# Patient Record
Sex: Female | Born: 1945 | Race: White | Hispanic: No | Smoking: Never smoker
Health system: Southern US, Community
[De-identification: ages and names within clinical notes are randomized; demographics above are authoritative.]

## PROBLEM LIST (undated history)

## (undated) DIAGNOSIS — M810 Age-related osteoporosis without current pathological fracture: Secondary | ICD-10-CM

## (undated) DIAGNOSIS — N3281 Overactive bladder: Secondary | ICD-10-CM

## (undated) DIAGNOSIS — N952 Postmenopausal atrophic vaginitis: Secondary | ICD-10-CM

## (undated) DIAGNOSIS — G47411 Narcolepsy with cataplexy: Secondary | ICD-10-CM

## (undated) DIAGNOSIS — I471 Supraventricular tachycardia, unspecified: Secondary | ICD-10-CM

## (undated) DIAGNOSIS — N390 Urinary tract infection, site not specified: Secondary | ICD-10-CM

## (undated) DIAGNOSIS — B2790 Infectious mononucleosis, unspecified without complication: Secondary | ICD-10-CM

## (undated) DIAGNOSIS — R519 Headache, unspecified: Secondary | ICD-10-CM

## (undated) DIAGNOSIS — T7840XA Allergy, unspecified, initial encounter: Secondary | ICD-10-CM

## (undated) DIAGNOSIS — I10 Essential (primary) hypertension: Secondary | ICD-10-CM

## (undated) DIAGNOSIS — F329 Major depressive disorder, single episode, unspecified: Secondary | ICD-10-CM

## (undated) DIAGNOSIS — K219 Gastro-esophageal reflux disease without esophagitis: Secondary | ICD-10-CM

## (undated) DIAGNOSIS — J45909 Unspecified asthma, uncomplicated: Secondary | ICD-10-CM

## (undated) DIAGNOSIS — R51 Headache: Secondary | ICD-10-CM

## (undated) DIAGNOSIS — I509 Heart failure, unspecified: Secondary | ICD-10-CM

## (undated) DIAGNOSIS — E785 Hyperlipidemia, unspecified: Secondary | ICD-10-CM

## (undated) DIAGNOSIS — M1990 Unspecified osteoarthritis, unspecified site: Secondary | ICD-10-CM

## (undated) DIAGNOSIS — D649 Anemia, unspecified: Secondary | ICD-10-CM

## (undated) DIAGNOSIS — B3324 Viral cardiomyopathy: Secondary | ICD-10-CM

## (undated) DIAGNOSIS — N6019 Diffuse cystic mastopathy of unspecified breast: Secondary | ICD-10-CM

## (undated) DIAGNOSIS — F32A Depression, unspecified: Secondary | ICD-10-CM

## (undated) DIAGNOSIS — J449 Chronic obstructive pulmonary disease, unspecified: Secondary | ICD-10-CM

## (undated) DIAGNOSIS — I429 Cardiomyopathy, unspecified: Secondary | ICD-10-CM

## (undated) DIAGNOSIS — N819 Female genital prolapse, unspecified: Secondary | ICD-10-CM

## (undated) DIAGNOSIS — I639 Cerebral infarction, unspecified: Secondary | ICD-10-CM

## (undated) DIAGNOSIS — G9332 Myalgic encephalomyelitis/chronic fatigue syndrome: Secondary | ICD-10-CM

## (undated) HISTORY — DX: Hyperlipidemia, unspecified: E78.5

## (undated) HISTORY — DX: Female genital prolapse, unspecified: N81.9

## (undated) HISTORY — PX: BACK SURGERY: SHX140

## (undated) HISTORY — DX: Cardiomyopathy, unspecified: I42.9

## (undated) HISTORY — DX: Gastro-esophageal reflux disease without esophagitis: K21.9

## (undated) HISTORY — DX: Essential (primary) hypertension: I10

## (undated) HISTORY — DX: Infectious mononucleosis, unspecified without complication: B27.90

## (undated) HISTORY — DX: Unspecified asthma, uncomplicated: J45.909

## (undated) HISTORY — DX: Diffuse cystic mastopathy of unspecified breast: N60.19

## (undated) HISTORY — DX: Allergy, unspecified, initial encounter: T78.40XA

## (undated) HISTORY — PX: ABLATION: SHX5711

## (undated) HISTORY — DX: Urinary tract infection, site not specified: N39.0

## (undated) HISTORY — DX: Depression, unspecified: F32.A

## (undated) HISTORY — DX: Major depressive disorder, single episode, unspecified: F32.9

## (undated) HISTORY — DX: Overactive bladder: N32.81

## (undated) HISTORY — PX: CARPAL TUNNEL RELEASE: SHX101

## (undated) HISTORY — PX: OTHER SURGICAL HISTORY: SHX169

## (undated) HISTORY — PX: BLADDER SURGERY: SHX569

## (undated) HISTORY — DX: Unspecified osteoarthritis, unspecified site: M19.90

## (undated) HISTORY — DX: Cerebral infarction, unspecified: I63.9

## (undated) HISTORY — PX: BREAST BIOPSY: SHX20

## (undated) HISTORY — DX: Postmenopausal atrophic vaginitis: N95.2

## (undated) HISTORY — DX: Anemia, unspecified: D64.9

## (undated) HISTORY — PX: NECK SURGERY: SHX720

## (undated) HISTORY — PX: TONSILLECTOMY: SUR1361

---

## 1988-03-27 HISTORY — PX: CERVICAL SPINE SURGERY: SHX589

## 1998-02-10 ENCOUNTER — Ambulatory Visit (HOSPITAL_COMMUNITY): Admission: RE | Admit: 1998-02-10 | Discharge: 1998-02-10 | Payer: Self-pay | Admitting: Obstetrics & Gynecology

## 1998-02-10 ENCOUNTER — Encounter: Payer: Self-pay | Admitting: Obstetrics & Gynecology

## 1998-09-24 ENCOUNTER — Encounter: Payer: Self-pay | Admitting: Emergency Medicine

## 1998-09-25 ENCOUNTER — Inpatient Hospital Stay (HOSPITAL_COMMUNITY): Admission: EM | Admit: 1998-09-25 | Discharge: 1998-09-30 | Payer: Self-pay | Admitting: Emergency Medicine

## 1998-09-28 ENCOUNTER — Encounter: Payer: Self-pay | Admitting: Internal Medicine

## 1998-09-29 ENCOUNTER — Encounter: Payer: Self-pay | Admitting: Internal Medicine

## 1999-04-07 ENCOUNTER — Other Ambulatory Visit: Admission: RE | Admit: 1999-04-07 | Discharge: 1999-04-07 | Payer: Self-pay | Admitting: Obstetrics & Gynecology

## 2000-08-07 ENCOUNTER — Other Ambulatory Visit: Admission: RE | Admit: 2000-08-07 | Discharge: 2000-08-07 | Payer: Self-pay | Admitting: Obstetrics & Gynecology

## 2001-08-26 ENCOUNTER — Other Ambulatory Visit: Admission: RE | Admit: 2001-08-26 | Discharge: 2001-08-26 | Payer: Self-pay | Admitting: Obstetrics & Gynecology

## 2002-03-05 ENCOUNTER — Emergency Department (HOSPITAL_COMMUNITY): Admission: EM | Admit: 2002-03-05 | Discharge: 2002-03-06 | Payer: Self-pay | Admitting: Emergency Medicine

## 2002-05-30 ENCOUNTER — Encounter: Payer: Self-pay | Admitting: Neurosurgery

## 2002-06-03 ENCOUNTER — Inpatient Hospital Stay (HOSPITAL_COMMUNITY): Admission: RE | Admit: 2002-06-03 | Discharge: 2002-06-06 | Payer: Self-pay | Admitting: Neurosurgery

## 2002-06-03 ENCOUNTER — Encounter: Payer: Self-pay | Admitting: Neurosurgery

## 2002-09-01 ENCOUNTER — Other Ambulatory Visit: Admission: RE | Admit: 2002-09-01 | Discharge: 2002-09-01 | Payer: Self-pay | Admitting: Obstetrics & Gynecology

## 2002-09-15 ENCOUNTER — Ambulatory Visit (HOSPITAL_COMMUNITY): Admission: RE | Admit: 2002-09-15 | Discharge: 2002-09-15 | Payer: Self-pay | Admitting: Internal Medicine

## 2003-06-11 ENCOUNTER — Ambulatory Visit (HOSPITAL_COMMUNITY): Admission: RE | Admit: 2003-06-11 | Discharge: 2003-06-11 | Payer: Self-pay | Admitting: Neurosurgery

## 2003-08-28 ENCOUNTER — Emergency Department (HOSPITAL_COMMUNITY): Admission: EM | Admit: 2003-08-28 | Discharge: 2003-08-29 | Payer: Self-pay | Admitting: Emergency Medicine

## 2003-10-06 ENCOUNTER — Other Ambulatory Visit: Admission: RE | Admit: 2003-10-06 | Discharge: 2003-10-06 | Payer: Self-pay | Admitting: Obstetrics & Gynecology

## 2004-05-04 ENCOUNTER — Ambulatory Visit: Payer: Self-pay | Admitting: Internal Medicine

## 2004-11-06 ENCOUNTER — Emergency Department (HOSPITAL_COMMUNITY): Admission: EM | Admit: 2004-11-06 | Discharge: 2004-11-06 | Payer: Self-pay | Admitting: Emergency Medicine

## 2005-02-04 ENCOUNTER — Emergency Department (HOSPITAL_COMMUNITY): Admission: EM | Admit: 2005-02-04 | Discharge: 2005-02-04 | Payer: Self-pay | Admitting: Emergency Medicine

## 2005-04-06 ENCOUNTER — Other Ambulatory Visit: Admission: RE | Admit: 2005-04-06 | Discharge: 2005-04-06 | Payer: Self-pay | Admitting: Obstetrics & Gynecology

## 2006-02-11 IMAGING — CR DG CHEST 2V
2 series · 2 of 2 positions shown · non-contrast
Comparison: 05/30/02.

CLINICAL DATA: Asthma, short of breath, hypertension, cardiomyopathy, history of Cam[place] infection, right carpal tunnel syndrome, preoperative evaluation. 
 TWO VIEW CHEST

[view not recorded (1 of 2)]
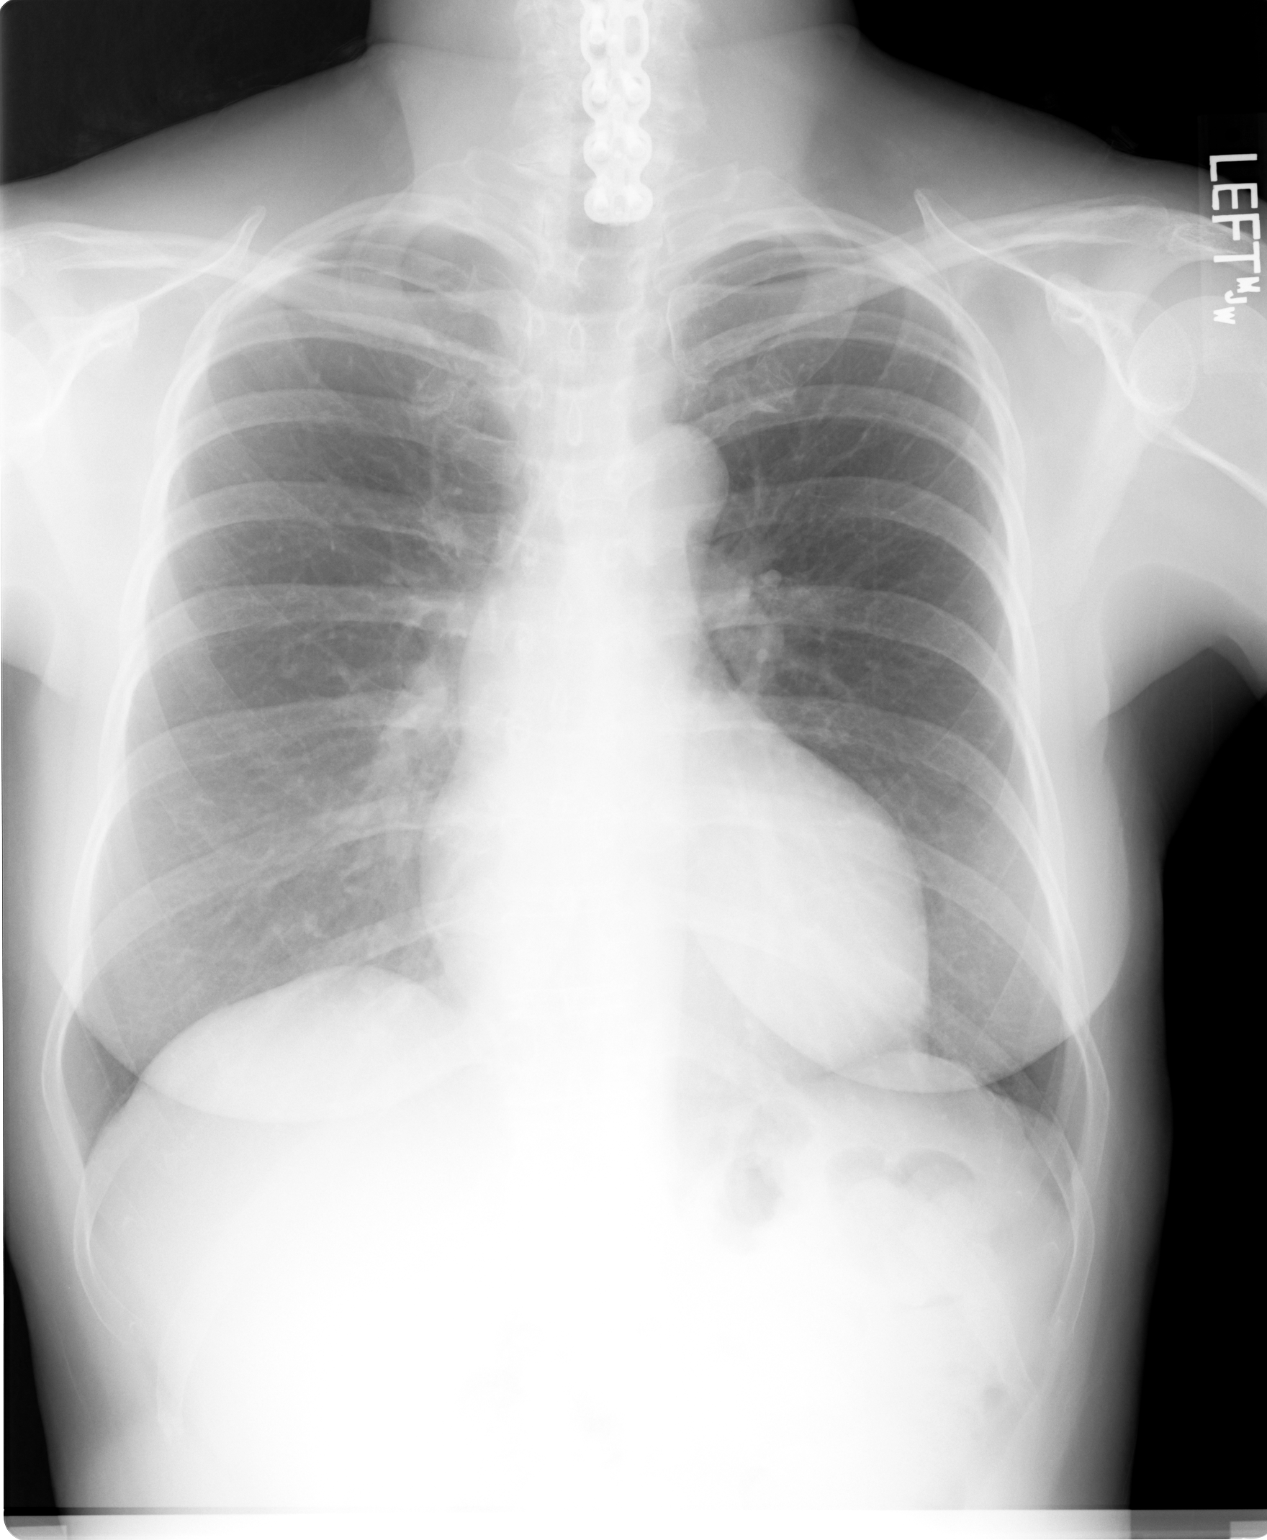

[view not recorded (2 of 2)]
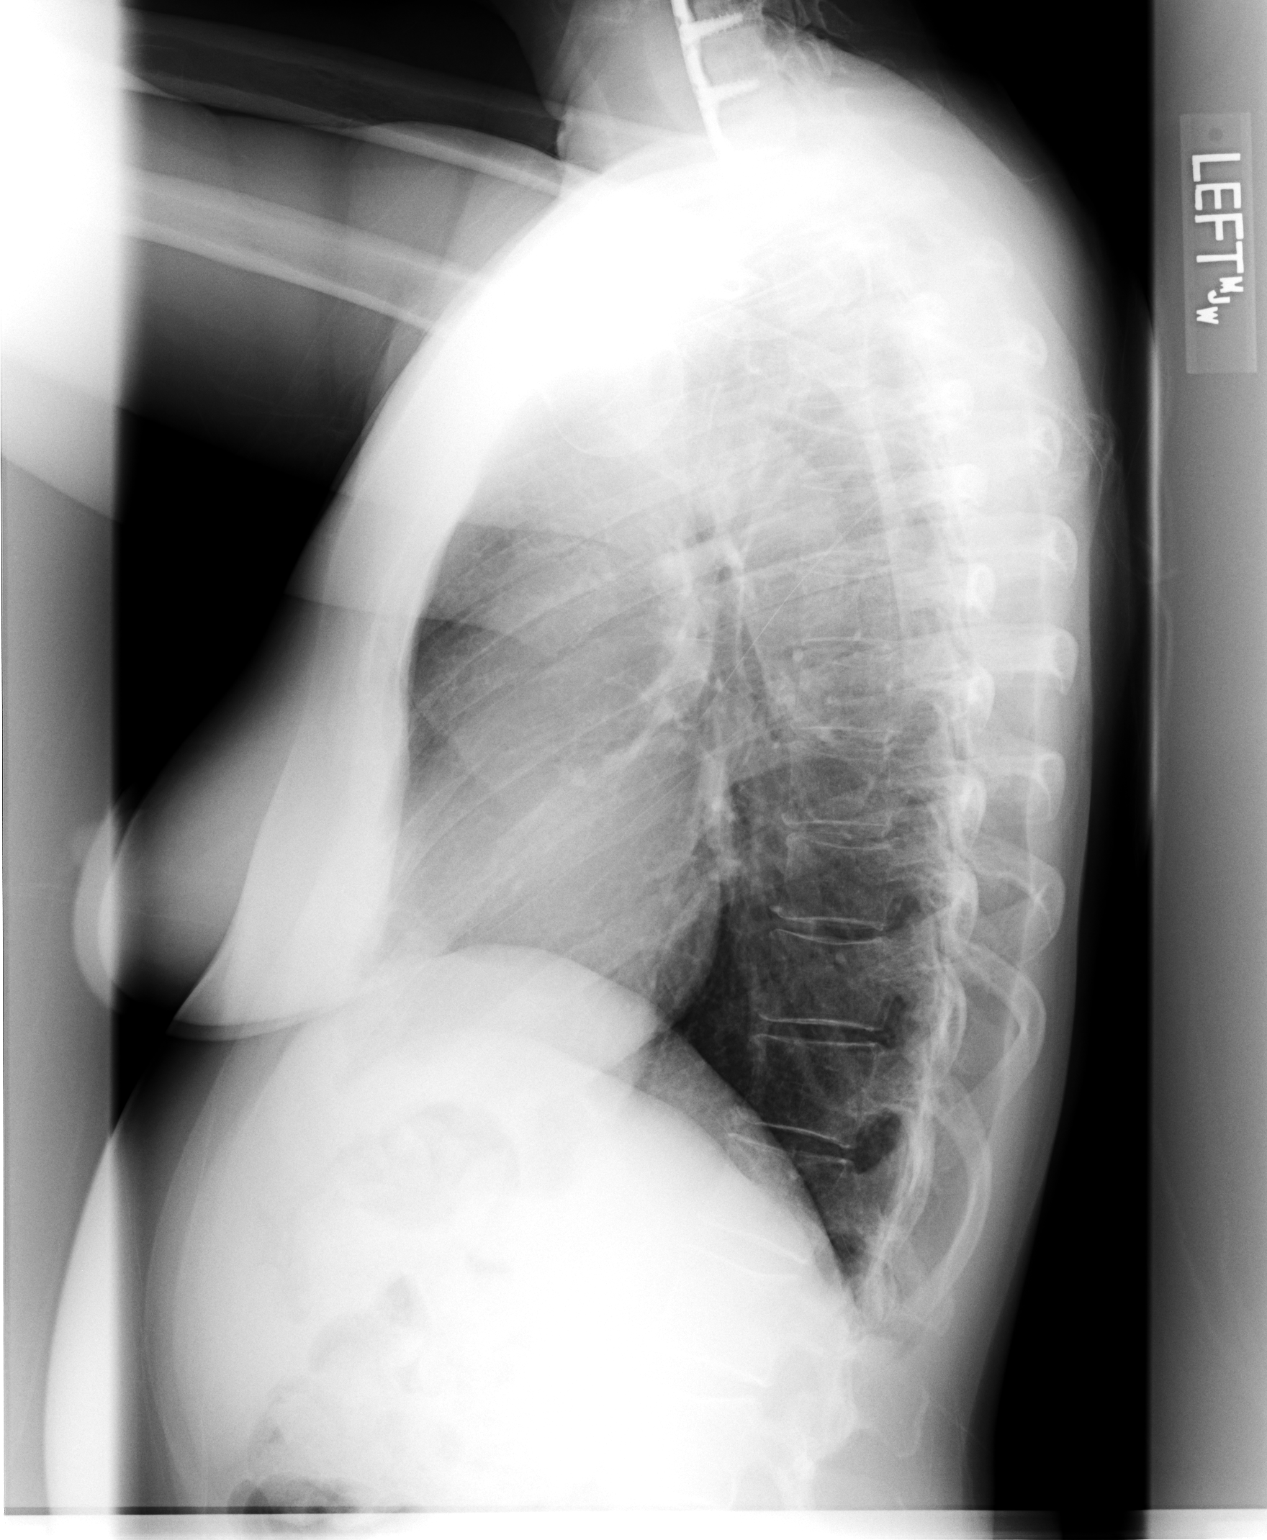

[2 of 2 positions shown; findings below may reference images not displayed]

Borderline enlargement of the cardiac silhouette with improvement.  Clear lungs.  Screw and plate fixation of the lower cervical spine. 
 IMPRESSION
 Borderline cardiomegaly with improvement.  No acute abnormality.

## 2006-08-06 ENCOUNTER — Ambulatory Visit: Payer: Self-pay | Admitting: Internal Medicine

## 2006-08-24 ENCOUNTER — Ambulatory Visit: Payer: Self-pay | Admitting: Obstetrics and Gynecology

## 2006-08-31 ENCOUNTER — Ambulatory Visit: Payer: Self-pay | Admitting: Obstetrics and Gynecology

## 2006-11-08 ENCOUNTER — Ambulatory Visit: Payer: Self-pay | Admitting: Internal Medicine

## 2006-11-29 ENCOUNTER — Ambulatory Visit: Payer: Self-pay | Admitting: Internal Medicine

## 2007-04-15 ENCOUNTER — Encounter: Admission: RE | Admit: 2007-04-15 | Discharge: 2007-04-15 | Payer: Self-pay | Admitting: Neurology

## 2007-08-29 ENCOUNTER — Ambulatory Visit: Payer: Self-pay | Admitting: Obstetrics and Gynecology

## 2008-03-27 DIAGNOSIS — I639 Cerebral infarction, unspecified: Secondary | ICD-10-CM

## 2008-03-27 HISTORY — DX: Cerebral infarction, unspecified: I63.9

## 2008-12-02 ENCOUNTER — Ambulatory Visit: Payer: Self-pay | Admitting: Obstetrics and Gynecology

## 2010-01-12 ENCOUNTER — Ambulatory Visit: Payer: Self-pay | Admitting: Obstetrics and Gynecology

## 2010-08-12 NOTE — Op Note (Signed)
NAMEALAJA, GOLDINGER                       ACCOUNT NO.:  [id number]   MEDICAL RECORD NO.:  [id number]                   PATIENT TYPE:  INP   LOCATION:  3172                                 FACILITY:  [facility]   PHYSICIAN:  Danae Orleans. Venetia Maxon, M.D.               DATE OF BIRTH:  December 09, 1945   DATE OF PROCEDURE:  06/03/2002  DATE OF DISCHARGE:                                 OPERATIVE REPORT   PREOPERATIVE DIAGNOSES:  Herniated cervical disk with cervical stenosis,  cervical spondylosis with myelopathy, degenerative disk disease, and  cervical radiculopathy, C3-4, C4-5, C5-6, and C6-7 levels.   POSTOPERATIVE DIAGNOSES:  Herniated cervical disk with cervical stenosis,  cervical spondylosis with myelopathy, degenerative disk disease, and  cervical radiculopathy, C3-4, C4-5, C5-6, and C6-7 levels.   PROCEDURE:  Anterior cervical decompression and fusion, C3-4, C4-5, C5-6,  and C6-7 levels with allograft bone graft and 77 mm Premier anterior  cervical plate.   SURGEON:  Danae Orleans. Venetia Maxon, M.D.   ASSISTANT:  Hilda Lias, M.D.   ANESTHESIA:  General endotracheal anesthesia.   ESTIMATED BLOOD LOSS:  75 mL.   COMPLICATIONS:  None.   DISPOSITION:  To recovery.   INDICATIONS:  The patient is a 65 year old woman with cervical spondylitic  myelopathy with multiple levels of spinal canal compromise and cord  compression, with bilateral upper extremity weakness and a long history of  neck and bilateral upper extremity pain.  It was elected to take her to  surgery for decompression and fusion at the affected levels.   DESCRIPTION OF PROCEDURE:  The patient was brought to the operating room.  Following the satisfactory and uncomplicated induction of general  endotracheal anesthesia and placement of intravenous lines, the patient  placed in a supine position on the operating room table.  Her neck was  placed in slight extension, and she was placed in 10 pounds of Holter  traction.  Her  anterior neck was the prepped and draped in the usual sterile  fashion.  The carotid tubercle was palpated on the left and then a  transverse incision was planned just superior to that from the midline to  the anterior border of the sternocleidomastoid muscle.  The platysmal layer  was incised sharply.  Subplatysmal dissection was performed to facilitate  exposure.  A bent spinal needle was placed at what was felt to be the C3-4  level, and this was confirmed on intraoperative x-ray.  Subsequently the  longus colli muscles were taken down from the anterior cervical spine from  C3 through C7 levels using electrocautery and Key elevator.  The Shadow Line  self-retaining retractor was placed to facilitate exposure along with an up-  down retractor.  Each of the interspaces was then incised with a 15 blade.  Each of the levels was extremely degenerated.  The Leksell rongeur was used  to remove ventral osteophytes.  The disk space spreader was initially placed  at the C5-6 level, and this disk space was cleared of residual disk material  and end plates stripped of residual cartilaginous and disk material.  Subsequently each of the end plates at each of the affected levels was then  similarly decompressed.  The operating microscope was then brought into the  field and initially at the C6-7 level the self-retaining retractor was  placed and under microscopic visualization, the end plates at C6 and C7 were  decorticated with the high-speed drill and the A2 equivalent bur.  The  uncinate spurs were drilled down.  Subsequently the posterior longitudinal  ligament and uncinate spurs were removed with 2 mm gold-tipped Kerrison  rongeur, resulting in significant decompression of the spinal canal and C7  nerve roots.  Hemostasis was assured with Gelfoam soaked in thrombin.  A 7  mm tricortical iliac crest bone graft was found to fit appropriately after  sizing with dummy sizers.  This was then cut down to  a depth of 13 mm and  inserted in the interspace and then counter sunk appropriately.  Attention  was then turned to the C5-6 level, where similar decompression was  performed.  At this level there was a densely calcified bone spur to the  right side of the midline, which was significantly compressing the spinal  cord and was compressing the C6 nerve root was it extended out the neural  foramen.  Following decompression of the central spinal cord and right  neural foramen, the left C6 nerve root was similarly decompressed.  Again  Gelfoam soaked in thrombin was used to facilitate hemostasis and a similarly-  sized bone graft was shaped, inserted, and counter sunk appropriately.  Attention was then turned to the C3-4 level, where similar decompression was  performed, the spinal cord was decompressed, and the posterior longitudinal  ligament removed.  The C4 nerve roots were decompressed bilaterally.  Hemostasis was again assured with Gelfoam soaked in thrombin, and a 7 mm  bone graft was cut to the appropriate depth and inserted into the interspace  and counter sunk appropriately.  Finally at the C4-5 level, similar  decompression was performed and at this level again there was a densely  calcified osteophyte, which was adherent to the dura.  This was very  carefully elevated and resulted in significant decompression of the thecal  sac, the spinal cord dura, and the C5 nerve root neural foramina.  Care was  taken not to instrument the neural foramina out of concern for the fragility  of the C5 nerve roots.  Hemostasis was again assured, and then a similarly-  sized bone graft was inserted and countersunk appropriately.  The Holter  traction was then removed and a 77 mm Premier anterior cervical plate was  sized, found to fit appropriately, and then was affixed to the anterior  cervical spine using 13 mm fixed-angled screws at C7 and variable-angled screws at C3, C5, and C6 levels.  At the C4  level there was poor purchase of  the left-sided screw and it was elected not to place a screw at this level  after a rescue screw also did not have good purchase.  The locking  mechanisms were engaged after all the screws were tightened down, and a  final x-ray confirmed position of the bone grafts, restoration of the  cervical lordosis, and the proper positioning of the anterior cervical  plate.  The wound was then copiously irrigated with kanamycin and saline,  and there was found to be  excellent hemostasis.  Soft tissues were inspected  and found to be in good repair.  The platysmal layer was then closed with 3-  0 Vicryl sutures, the skin edges were reapproximated with running 4-0 Vicryl  subcuticular stitch, and the wound was dressed with Dermabond.  The patient  was placed in an Aspen collar, extubated in the operating room, and taken to  the recovery room in stable and satisfactory condition, having tolerated her  operation well.  Counts were correct at the end of the case.                                               Danae Orleans. Venetia Maxon, M.D.    JDS/MEDQ  D:  06/03/2002  T:  06/03/2002  Job:  [id number]

## 2010-08-12 NOTE — Op Note (Signed)
   NAME:  Marie Fisher, Marie Fisher                       ACCOUNT NO.:  [id number]   MEDICAL RECORD NO.:  [id number]                   PATIENT TYPE:  AMB   LOCATION:  ENDO                                 [facility]ACILITY[facility]  [facility]   PHYSICIAN:  Lina Sar, M.D. [facility]               DATE OF BIRTH:  08/09/1945   DATE OF PROCEDURE:  DATE OF DISCHARGE:                                 OPERATIVE REPORT   Copy only                                               Lina Sar, M.D. [facility]    DB/MEDQ  D:  09/15/2002  T:  09/15/2002  Job:  [id number]   cc:   Aram Beecham, M.D.  [facility]

## 2010-08-12 NOTE — Discharge Summary (Signed)
   NAMESELAH, KLANG                       ACCOUNT NO.:  [id number]   MEDICAL RECORD NO.:  [id number]                   PATIENT TYPE:  INP   LOCATION:  3008                                 FACILITY:  [facility]   PHYSICIAN:  Danae Orleans. Venetia Maxon, M.D.               DATE OF BIRTH:  [birth year]   DATE OF ADMISSION:  06/03/2002  DATE OF DISCHARGE:  06/06/2002                                 DISCHARGE SUMMARY   REASON FOR ADMISSION:  1. Cervical disk herniation.  2. 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  3. Cervical spondylosis with myelopathy.  4. Cervical disk degeneration.  5. Hypertension not otherwise specified.  6. Esophageal reflux.  7. Nonspecified viral infection.   DISCHARGE DIAGNOSES:  1. Cervical disk herniation.  2. 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  3. Cervical spondylosis with myelopathy.  4. Cervical disk degeneration.  5. Hypertension not otherwise specified.  6. Esophageal reflux.  7. Nonspecified viral infection.   HISTORY OF PRESENT ILLNESS:  The patient is a 65 year old woman with  cervical disk herniations and cervical myelopathy at the C3-4, C4-5, C5-6,  and C6-7 levels.  It was elected to take her to surgery for anterior  cervical decompression and fusion at these affected levels.   HOSPITAL COURSE:  She did well with the surgery and underwent anterior  cervical compression and fusion at C3-4, C4-5, C5-6, and C6-7 levels with an  allograft bone graft and anterior cervical plating.  Postoperatively, she  had improvement in upper extremity strength and also in her numbness.  She  was doing well.  On March 10, was given Decadron for 24 hours and was  otherwise without complaints.  She was immobilized on March 11 and doing  better with less numbness and on March 12 was doing well and at that point  she was discharged home with instructions to follow up with Dr. Venetia Maxon in his  office in three weeks with lateral cervical spine  radiographs.  She was  instructed to wear a collar, instructed not to engage in any driving or  lifting for the next 2-3 weeks and was given prescriptions of Vicodin,  Valium, along with her regular medications.                                               Danae Orleans. Venetia Maxon, M.D.    JDS/MEDQ  D:  07/27/2002  T:  07/27/2002  Job:  [id number]

## 2010-08-12 NOTE — Op Note (Signed)
   NAME:  Marie Fisher, Marie Fisher                       ACCOUNT NO.:  [id number]   MEDICAL RECORD NO.:  [id number]                   PATIENT TYPE:  AMB   LOCATION:  ENDO                                 FACILITY:  [facility]   PHYSICIAN:  Lina Sar, M.D. [facility]               DATE OF BIRTH:  Feb 06, 1946   DATE OF PROCEDURE:  DATE OF DISCHARGE:                                 OPERATIVE REPORT   NAME OF PROCEDURE:  Colonoscopy.   INDICATIONS:  This 65 year old white female has been found anemic.  Hemoglobin 11 g.  She has a history of chronic iron deficiency anemia and  has been on iron supplements.  Other GI problems have included hiatal hernia  and gastroesophageal reflux for which she takes Protonix.  On physical  examination on September 04, 2002 the physical examination was nondiagnostic.  She has had intermittent hematochezia.  For that reason she is undergoing  colonoscopy.   ENDOSCOPE:  Olympus single channel video endoscope.   SEDATION:  Versed 70 mg IV, Demerol 5 mg IV.   FINDINGS:  Olympus single channel video endoscope passed under direct vision  into rectum to sigmoid colon.  The patient was monitored by pulse oximeter.  Her oxygen saturations were normal.  Anal canal and rectal ampulla was  unremarkable.  Colonoscopy passed easily through the sigmoid colon which was  normal appearing mucosa.  There were no diverticula.  Descending colon,  splenic flexure, transverse colon, hepatic flexure was normal with normal  ascending colon at the cecum.  Cecal pouch was reached without difficulty  and ileocecal valve and appendiceal opening were noted.  There were no  lesions to explain GI blood loss.  Colonoscope was then slowly retracted  through right transverse to right colon and colon was decompressed.  The  patient tolerated procedure well.   IMPRESSION:  Normal colonoscopy to the cecum.  Nothing to account for  anemia.   PLAN:  1. Repeat colonoscopy in 10 years or earlier if patient  develops specific     symptoms.  2. Treat hematochezia with topical steroids for anorectal source of     bleeding.  3. Repeat Hemoccults every year and supplement iron as needed.                                               Lina Sar, M.D. [facility]    DB/MEDQ  D:  09/15/2002  T:  09/15/2002  Job:  [id number]   cc:   Freddy Finner, M.D.  [address] [place]  [place] [address]  Fax: [phone]

## 2010-08-12 NOTE — Op Note (Signed)
Marie Fisher, DRIVER                       ACCOUNT NO.:  [id number]   MEDICAL RECORD NO.:  [id number]                   PATIENT TYPE:  OIB   LOCATION:  2899                                 FACILITY:  [facility]   PHYSICIAN:  Danae Orleans. Venetia Maxon, M.D.               DATE OF BIRTH:  08-31-1945   DATE OF PROCEDURE:  06/11/2003  DATE OF DISCHARGE:                                 OPERATIVE REPORT   PREOPERATIVE DIAGNOSIS:  Right carpal tunnel syndrome.   POSTOPERATIVE DIAGNOSIS:  Right carpal tunnel syndrome.   PROCEDURE:  Right carpal tunnel release.   SURGEON:  Danae Orleans. Venetia Maxon, M.D.   ANESTHESIA:  MAC with local lidocaine.   COMPLICATIONS:  None.   DISPOSITION:  To the recovery room.   INDICATIONS FOR PROCEDURE:  Sabryn Preslar is a 65 year old woman with  right carpal tunnel syndrome.  It was elected to perform right carpal tunnel  release.   PROCEDURE:  Ms. Borello was brought to the operating room.  Following the  induction of monitored sedation, she was placed in the supine position on  the operating table.  The right arm was placed on an extremity table.  The  right hand and forearm were then prepped and draped in the usual sterile  fashion using a sterile stockinette and an extremity drape.  The plane of  incision was marked and infiltrated with local lidocaine without  epinephrine.  An incision was made just distal to the first volar wrist  crease extending approximately 2 cm into the palm of the hand in line with  the fourth ray.  It was carried through the flexor retinaculum which  appeared to be quite thickened.  This was then incised with a 15 blade and  the incision was carried proximally in the volar forearm and distally into  the palm with good release of the carpal tunnel.  There did not appear to be  any persistent compression.  The wound was then copiously irrigated with  saline and closed with interrupted vertical mattress 3-0 nylon stitches.  The wound was dressed  with a sterile occlusive fluff and Kerlix dressing.  The patient was taken to the recovery room in stable, satisfactory  condition, having tolerated the operation well.  Counts were correct at the  end of the case.                                               Danae Orleans. Venetia Maxon, M.D.    JDS/MEDQ  D:  06/11/2003  T:  06/12/2003  Job:  [id number]

## 2011-03-29 ENCOUNTER — Ambulatory Visit: Payer: Self-pay | Admitting: Obstetrics and Gynecology

## 2012-04-01 ENCOUNTER — Ambulatory Visit: Payer: Self-pay | Admitting: Obstetrics and Gynecology

## 2012-08-14 ENCOUNTER — Encounter: Payer: Self-pay | Admitting: Internal Medicine

## 2012-09-10 ENCOUNTER — Encounter: Payer: Self-pay | Admitting: Internal Medicine

## 2012-10-07 DIAGNOSIS — I471 Supraventricular tachycardia: Secondary | ICD-10-CM | POA: Insufficient documentation

## 2012-10-18 ENCOUNTER — Encounter: Payer: Self-pay | Admitting: Internal Medicine

## 2012-11-01 ENCOUNTER — Encounter: Payer: Self-pay | Admitting: Internal Medicine

## 2012-11-06 ENCOUNTER — Ambulatory Visit (AMBULATORY_SURGERY_CENTER): Payer: Medicare Other | Admitting: *Deleted

## 2012-11-06 VITALS — Ht 62.0 in | Wt 137.0 lb

## 2012-11-06 DIAGNOSIS — Z1211 Encounter for screening for malignant neoplasm of colon: Secondary | ICD-10-CM

## 2012-11-06 MED ORDER — MOVIPREP 100 G PO SOLR: ORAL | Status: DC

## 2012-11-06 NOTE — Progress Notes (Signed)
Patient denies any allergies to eggs or soy. Patient denies any problems with anesthesia.  

## 2012-11-07 ENCOUNTER — Encounter: Payer: Self-pay | Admitting: Internal Medicine

## 2012-11-20 ENCOUNTER — Encounter: Payer: Self-pay | Admitting: Internal Medicine

## 2012-12-18 ENCOUNTER — Ambulatory Visit (AMBULATORY_SURGERY_CENTER): Payer: Medicare Other | Admitting: Internal Medicine

## 2012-12-18 ENCOUNTER — Encounter: Payer: Self-pay | Admitting: Internal Medicine

## 2012-12-18 VITALS — BP 133/72 | HR 61 | Temp 98.5°F | Resp 19 | Ht 62.0 in | Wt 137.0 lb

## 2012-12-18 DIAGNOSIS — Z1211 Encounter for screening for malignant neoplasm of colon: Secondary | ICD-10-CM

## 2012-12-18 MED ORDER — SODIUM CHLORIDE 0.9 % IV SOLN: 500.0000 mL | INTRAVENOUS | Status: DC

## 2012-12-18 NOTE — Progress Notes (Signed)
Report to pacu rn, vss, bbs=clear 

## 2012-12-18 NOTE — Op Note (Signed)
[facility] [address] [place], [address]   COLONOSCOPY PROCEDURE REPORT  PATIENT: Marie Fisher, Marie Fisher  MR#: [id number] BIRTHDATE: March 31, 1945 , 66  yrs. old GENDER: Female ENDOSCOPIST: Hart Carwin, MD REFERRED XB:JYNWGN colonoscopy PROCEDURE DATE:  12/18/2012 PROCEDURE:   Colonoscopy, screening , Dr Konrad Dolores First Screening Colonoscopy - Avg.  risk and is 50 yrs.  old or older - No.  Prior Negative Screening - Now for repeat screening. 10 or more years since last screening  History of Adenoma - Now for follow-up colonoscopy & has been > or = to 3 yrs.  N/A  Polyps Removed Today? No.  Recommend repeat exam, <10 yrs? No. ASA CLASS:   Class II INDICATIONS:Average risk patient for colon cancer. MEDICATIONS: MAC sedation, administered by CRNA and propofol (Diprivan) 250mg  IV  DESCRIPTION OF PROCEDURE:   After the risks benefits and alternatives of the procedure were thoroughly explained, informed consent was obtained.  A digital rectal exam revealed no abnormalities of the rectum.   The LB [id number]-[id number]  endoscope was introduced through the anus and advanced to the cecum, which was identified by both the appendix and ileocecal valve. No adverse events experienced.   The quality of the prep was good, using MoviPrep  The instrument was then slowly withdrawn as the colon was fully examined.      COLON FINDINGS: Moderate diverticulosis was noted in the sigmoid colon.  Retroflexed views revealed no abnormalities. The time to cecum=8 minutes 57 seconds.  Withdrawal time=6 minutes 53 seconds. The scope was withdrawn and the procedure completed. COMPLICATIONS: There were no complications.  ENDOSCOPIC IMPRESSION: Moderate diverticulosis was noted in the sigmoid colon  RECOMMENDATIONS: High fiber diet   eSigned:  Hart Carwin, MD 12/18/2012 2:37 PM   cc:

## 2012-12-18 NOTE — Patient Instructions (Addendum)

## 2012-12-19 ENCOUNTER — Telehealth: Payer: Self-pay | Admitting: *Deleted

## 2012-12-19 NOTE — Telephone Encounter (Signed)
  Follow up Call-  Call back number 12/18/2012  Post procedure Call Back phone  # [phone]  Permission to leave phone message Yes     Patient questions:  Do you have a fever, pain , or abdominal swelling? no Pain Score  0 *  Have you tolerated food without any problems? yes  Have you been able to return to your normal activities? yes  Do you have any questions about your discharge instructions: Diet   no Medications  no Follow up visit  no  Do you have questions or concerns about your Care? no  Actions: * If pain score is 4 or above: No action needed, pain <4.

## 2012-12-26 DIAGNOSIS — I5042 Chronic combined systolic (congestive) and diastolic (congestive) heart failure: Secondary | ICD-10-CM | POA: Insufficient documentation

## 2012-12-26 DIAGNOSIS — I519 Heart disease, unspecified: Secondary | ICD-10-CM | POA: Insufficient documentation

## 2012-12-26 DIAGNOSIS — I341 Nonrheumatic mitral (valve) prolapse: Secondary | ICD-10-CM | POA: Insufficient documentation

## 2013-05-26 ENCOUNTER — Ambulatory Visit: Payer: Self-pay | Admitting: Internal Medicine

## 2013-12-18 ENCOUNTER — Ambulatory Visit: Payer: Self-pay

## 2014-03-02 ENCOUNTER — Ambulatory Visit: Payer: Self-pay

## 2014-06-03 ENCOUNTER — Ambulatory Visit: Payer: Self-pay | Admitting: Internal Medicine

## 2014-08-10 ENCOUNTER — Encounter: Payer: Self-pay | Admitting: Urology

## 2014-08-10 DIAGNOSIS — B2790 Infectious mononucleosis, unspecified without complication: Secondary | ICD-10-CM

## 2014-08-10 DIAGNOSIS — N3281 Overactive bladder: Secondary | ICD-10-CM

## 2014-08-10 DIAGNOSIS — K219 Gastro-esophageal reflux disease without esophagitis: Secondary | ICD-10-CM

## 2014-08-10 DIAGNOSIS — E785 Hyperlipidemia, unspecified: Secondary | ICD-10-CM

## 2014-08-10 DIAGNOSIS — N819 Female genital prolapse, unspecified: Secondary | ICD-10-CM | POA: Insufficient documentation

## 2014-08-10 DIAGNOSIS — N952 Postmenopausal atrophic vaginitis: Secondary | ICD-10-CM

## 2014-08-10 DIAGNOSIS — N6019 Diffuse cystic mastopathy of unspecified breast: Secondary | ICD-10-CM

## 2014-08-10 DIAGNOSIS — N390 Urinary tract infection, site not specified: Secondary | ICD-10-CM | POA: Insufficient documentation

## 2014-08-10 HISTORY — DX: Diffuse cystic mastopathy of unspecified breast: N60.19

## 2014-08-10 HISTORY — DX: Infectious mononucleosis, unspecified without complication: B27.90

## 2014-08-10 HISTORY — DX: Overactive bladder: N32.81

## 2014-08-10 HISTORY — DX: Gastro-esophageal reflux disease without esophagitis: K21.9

## 2014-08-10 HISTORY — DX: Postmenopausal atrophic vaginitis: N95.2

## 2014-08-10 HISTORY — DX: Hyperlipidemia, unspecified: E78.5

## 2014-08-10 HISTORY — DX: Female genital prolapse, unspecified: N81.9

## 2014-08-26 ENCOUNTER — Other Ambulatory Visit: Payer: Self-pay | Admitting: *Deleted

## 2014-08-26 ENCOUNTER — Ambulatory Visit (INDEPENDENT_AMBULATORY_CARE_PROVIDER_SITE_OTHER): Payer: Medicare PPO | Admitting: Urology

## 2014-08-26 ENCOUNTER — Encounter: Payer: Self-pay | Admitting: Urology

## 2014-08-26 VITALS — BP 136/87 | HR 71 | Ht 62.0 in | Wt 143.7 lb

## 2014-08-26 DIAGNOSIS — R350 Frequency of micturition: Secondary | ICD-10-CM | POA: Diagnosis not present

## 2014-08-26 NOTE — Progress Notes (Signed)
Session #8 Health & Social Factors:No change Caffeine #day:1 Alcohol #day:0 Daytime Voids #day:5 Night Time Voids # night:0-1 Urgency:Mild Incontinence Episodes # day:0 Ankle used:Left Treatment setting:9 Feeling/Response:Both Preformed OE:UMPNTIR McGowan PA-C Assistant:Arron Mcnaught Jimmye Norman CMA

## 2014-08-27 DIAGNOSIS — D649 Anemia, unspecified: Secondary | ICD-10-CM | POA: Insufficient documentation

## 2014-08-27 DIAGNOSIS — I10 Essential (primary) hypertension: Secondary | ICD-10-CM | POA: Insufficient documentation

## 2014-08-27 NOTE — Progress Notes (Signed)
Chief Complaint:  Chief Complaint  Patient presents with  . Urinary Frequency     HPI:     Patient's urinary incontinence began several years ago resulting in the patient undergoing a bladder sling procedure in 2008.   She does not recall a specific event that led to the urinary incontinence. Her amount of leakage is variable. She leaks slightly with laughing, straining, and or coughing. She leaks larger amounts when she experiences an urge to void. Her leakage can occur at any time.      After 7 treatments with the PTNS, she is not experiencing any urinary leakage. Over the last week, she had daytime frequency of 6 trips to the bathroom while awake. She experienced nocturia once nightly over the last week. She is not experiencing any pain or burning with urination.   She only has 1 caffeinated beverage daily and does not consume any alcohol.                      Previous Therapy:     She is doing Kegel's at home on her own.                                 Anticholinergics        Patient was started on Vesicare 5 mg daily and then increased to 10 mg daily in December 2015. Unfortunately, she suffered memory loss 2 months later and had to discontinue the Vesicare.     Other previous therapy          Patient is currently on 50 mg of Myrbetriq daily.  She is also on Macrobid 100 mg 1 capsule daily.  BP 136/87 mmHg  Pulse 71  Ht 5\' 2"  (1.575 m)  Wt 143 lb 11.2 oz (65.182 kg)  BMI 26.28 kg/m2  LMP  (Exact Date)  Patient had no contraindications present for PTNS therapy.  She denied having a pacemaker, an implantable defibrillator, history of abnormal bleeding and/or history of neuropath these or nerve damage.        Discussed with patient possible complications of procedure, such as discomfort, bleeding at insertion/stimulation site, procedure consent signed  Patient goals:     Patient's goals for PTNS are reduced urinary urgency and reduced frequency  PTNS treatment:     The  patient's left ankle is cleansed with rubbing alcohol. The needle electrode was inserted into the lower, inner aspect of patient's leg. A surface electrode was placed on the inside arch of the foot on the treatment leg. The lead set was connected to the stimulator and the needle electrode clip was connected to the needle electrode. The stimulator that produces an adjustable electrical pulse that travels to sacral nerve plexus via the tibial and nerve was increased to 9 and patient received a toe flex and sensory response to the stimulus. She tolerated the procedure well for 30 minutes.  Treatment Plan:     Patient has been pleased so far with her treatment progress. She will return next week for her 9th PTNS treatment.

## 2014-09-02 ENCOUNTER — Ambulatory Visit (INDEPENDENT_AMBULATORY_CARE_PROVIDER_SITE_OTHER): Payer: Medicare PPO | Admitting: Urology

## 2014-09-02 ENCOUNTER — Encounter: Payer: Self-pay | Admitting: Urology

## 2014-09-02 VITALS — BP 162/82 | HR 79 | Ht 62.0 in | Wt 143.9 lb

## 2014-09-02 DIAGNOSIS — R350 Frequency of micturition: Secondary | ICD-10-CM

## 2014-09-02 NOTE — Progress Notes (Signed)
PTNS  Session # 9 Health & Social Factors: Change out of town again Caffeine: 1 Alcohol: 0 Daytime voids #per day: 7 Night-time voids #per night: 2 Urgency: 2 Incontinence Episodes #per day: 1 Ankle used: Left Treatment Setting: 13 Feeling/ Response: Both  Preformed By: Zara Council PA-C   Assistant: Lyndee Hensen CMA  Follow Up: One week

## 2014-09-05 DIAGNOSIS — R350 Frequency of micturition: Secondary | ICD-10-CM | POA: Insufficient documentation

## 2014-09-05 NOTE — Progress Notes (Signed)
Chief Complaint:  Chief Complaint  Patient presents with  . Overactive bladder    PTNS      HPI:     Patient's urinary incontinence began several years ago resulting in the patient undergoing a bladder sling procedure in 2008.   She does not recall a specific event that led to the urinary incontinence. Her amount of leakage is variable. She leaks slightly with laughing, straining, and or coughing. She leaks larger amounts when she experiences an urge to void. Her leakage can occur at any time.      After 8 treatments with the PTNS, she had worsening of her urinary symptoms.  Over the last week, she had 1 episode of urinary incontinence. She had daytime frequency of 7 trips to the bathroom while awake. She experienced nocturia twice nightly over the last week. She is not experiencing any pain or burning with urination.   She only has 1 caffeine beverage daily and does not consume any alcohol.                      Previous Therapy:     She is doing Keg[employer] at home on her own.                                Anticholinergics        Patient was started on Vesicare 5 mg daily and then increased to 10 mg daily in December 2015. Unfortunately, she suffered memory loss 2 months later and had to discontinue the Vesicare.     Other previous therapy          Patient is currently on 50 mg of Myrbetriq daily.  She is also on Macrobid 100 mg 1 capsule daily.  Blood pressure 162/82, pulse 79, height 5\' 2"  (1.575 m), weight 143 lb 14.4 oz (65.273 kg).  Patient has no contraindications present for PTNS therapy.  She denied having a pacemaker, an implantable defibrillator, history of abnormal bleeding and/or history of neuropath these or nerve damage.    Patient goals:     Patient's goals for PTNS are reduced urinary urgency and reduced frequency  PTNS treatment:     The patient's left ankle is cleansed with rubbing alcohol. The needle electrode was inserted into the lower, inner aspect of patient's  leg. A surface electrode was placed on the inside arch of the foot on the treatment leg. The lead set was connected to the stimulator and the needle electrode clip was connected to the needle electrode. The stimulator that produces an adjustable electrical pulse that travels to sacral nerve plexus via the tibial and nerve was increased to 13 and patient received a toe flex and sensory response to the stimulus. She tolerated the procedure well for 30 minutes.  Treatment Plan:     Patient is frustrated with her urinary symptoms over the last week. She had been occupied preparing for her grandchild birthday party and attending the birthday party and she experienced a worsening in her symptoms. She believes she did not take her Myrbetriq  that day and that contributed to worsening of her symptoms.. She will return next week for her 10th PTNS treatment.  I encouraged her to continue with the treatment and not allow the setback to discourage her.

## 2014-09-09 ENCOUNTER — Ambulatory Visit (INDEPENDENT_AMBULATORY_CARE_PROVIDER_SITE_OTHER): Payer: Medicare PPO | Admitting: Urology

## 2014-09-09 ENCOUNTER — Encounter: Payer: Self-pay | Admitting: Urology

## 2014-09-09 VITALS — BP 111/69 | HR 91 | Ht 64.0 in | Wt 141.9 lb

## 2014-09-09 DIAGNOSIS — R350 Frequency of micturition: Secondary | ICD-10-CM

## 2014-09-09 NOTE — Progress Notes (Signed)
PTNS  Session # 10  Health & Social Factors: No Change Caffeine:1 Alcohol: 0 Daytime voids #per day: 7 Night-time voids #per night: 1 Urgency: strong + Incontinence Episodes #per day: 0 Ankle used: Left  Treatment Setting: 7  Feeling/ Response: both  Preformed By: Dwaine Gale PA-C  Assistant: Lyndee Hensen CMA   Follow Up: One week

## 2014-09-16 ENCOUNTER — Encounter: Payer: Self-pay | Admitting: Urology

## 2014-09-16 ENCOUNTER — Ambulatory Visit (INDEPENDENT_AMBULATORY_CARE_PROVIDER_SITE_OTHER): Payer: Medicare PPO | Admitting: Urology

## 2014-09-16 VITALS — BP 109/72 | HR 85 | Ht 62.0 in | Wt 142.8 lb

## 2014-09-16 DIAGNOSIS — R350 Frequency of micturition: Secondary | ICD-10-CM | POA: Diagnosis not present

## 2014-09-16 NOTE — Progress Notes (Signed)
PTNS  Session # 11  Health & Social Factors: No change Caffeine: 1 Alcohol: 0 Daytime voids #per day: 8 Night-time voids #per night: 0 Urgency: Strong Incontinence Episodes #per day: 0 Ankle used: Left Treatment Setting: 12 Feeling/ Response: Both   Preformed By: Zara Council PA-C  Assistant: Lyndee Hensen CMA  Follow Up: One week

## 2014-09-17 NOTE — Progress Notes (Signed)
Chief Complaint:  Chief Complaint  Patient presents with  . Overactive Bladder    PTNS     HPI:     Patient's urinary incontinence began several years ago resulting in the patient undergoing a bladder sling procedure in 2008.   She does not recall a specific event that led to the urinary incontinence. Her amount of leakage is variable. She leaks slightly with laughing, straining, and or coughing. She leaks larger amounts when she experiences an urge to void. Her leakage can occur at any time.      After 8 treatments with the PTNS, she had worsening of her urinary symptoms.  Over the last week, she had 0 episode of urinary incontinence. She had daytime frequency of 7 trips to the bathroom while awake. She experienced nocturia once nightly over the last week. She is not experiencing any pain or burning with urination.   She only has 1 caffeine beverage daily and does not consume any alcohol.                      Previous Therapy:     She is doing [employer]egel[employer] at home on her own.                                Anticholinergics        Patient was started on Vesicare 5 mg daily and then increased to 10 mg daily in December 2015. Unfortunately, she suffered memory loss 2 months later and had to discontinue the Vesicare.     Other previous therapy          Patient is currently on 50 mg of Myrbetriq daily.  She is also on Macrobid 100 mg 1 capsule daily.  Blood pressure 111/69, pulse 91, height 5\' 4"  (1.626 m), weight 141 lb 14.4 oz (64.365 kg).  Patient has no contraindications present for PTNS therapy.  She denied having a pacemaker, an implantable defibrillator, history of abnormal bleeding and/or history of neuropath these or nerve damage.    Patient goals:     Patient's goals for PTNS are reduced urinary urgency and reduced frequency  PTNS treatment:     The patient's left ankle is cleansed with rubbing alcohol. The needle electrode was inserted into the lower, inner aspect of patient's leg.  A surface electrode was placed on the inside arch of the foot on the treatment leg. The lead set was connected to the stimulator and the needle electrode clip was connected to the needle electrode. The stimulator that produces an adjustable electrical pulse that travels to sacral nerve plexus via the tibial and nerve was increased to 7 and patient received a toe flex and sensory response to the stimulus. She tolerated the procedure well for 30 minutes.  Treatment Plan:     Patient was back in town and had more control over her diet and schedule.  She feels better about the treatment and would like to continue the PTNS,  She will return next week for her 11th PTNS

## 2014-09-20 NOTE — Progress Notes (Signed)
Chief Complaint:  Chief Complaint  Patient presents with  . PTNS     HPI:     Patient's urinary incontinence began several years ago resulting in the patient undergoing a bladder sling procedure in 2008.   She does not recall a specific event that led to the urinary incontinence. Her amount of leakage is variable. She leaks slightly with laughing, straining, and or coughing. She leaks larger amounts when she experiences an urge to void. Her leakage can occur at any time.      After 8 treatments with the PTNS, she had worsening of her urinary symptoms.  Today, she presents for her 11th PTNS.  Over the last week, she had 0 episode of urinary incontinence. She had daytime frequency of 8 trips to the bathroom while awake. She experienced no nocturia  over the last week. She is not experiencing any pain or burning with urination.   She only has 1 caffeine beverage daily and does not consume any alcohol.                      Previous Therapy:     She is doing Keg[employer] at home on her own.                                Anticholinergics        Patient was started on Vesicare 5 mg daily and then increased to 10 mg daily in December 2015. Unfortunately, she suffered memory loss 2 months later and had to discontinue the Vesicare.     Other previous therapy          Patient is currently on 50 mg of Myrbetriq daily.  She is also on Macrobid 100 mg 1 capsule daily.  Blood pressure 109/72, pulse 85, height 5\' 2"  (1.575 m), weight 142 lb 12.8 oz (64.774 kg).  Patient has no contraindications present for PTNS therapy.  She denied having a pacemaker, an implantable defibrillator, history of abnormal bleeding and/or history of neuropath these or nerve damage.    Patient goals:     Patient's goals for PTNS are reduced urinary urgency and reduced frequency  PTNS treatment:     The patient's left ankle is cleansed with rubbing alcohol. The needle electrode was inserted into the lower, inner aspect of  patient's leg. A surface electrode was placed on the inside arch of the foot on the treatment leg. The lead set was connected to the stimulator and the needle electrode clip was connected to the needle electrode. The stimulator that produces an adjustable electrical pulse that travels to sacral nerve plexus via the tibial and nerve was increased to 12 and patient received a toe flex and sensory response to the stimulus. She tolerated the procedure well for 30 minutes.  Treatment Plan:      She feels better about the treatment and would like to continue the PTNS,  She will return next week for her 12th PTNS.

## 2014-09-23 ENCOUNTER — Encounter: Payer: Self-pay | Admitting: Urology

## 2014-09-23 ENCOUNTER — Ambulatory Visit (INDEPENDENT_AMBULATORY_CARE_PROVIDER_SITE_OTHER): Payer: Medicare PPO | Admitting: Urology

## 2014-09-23 VITALS — BP 133/86 | HR 76 | Ht 62.0 in | Wt 142.0 lb

## 2014-09-23 DIAGNOSIS — R350 Frequency of micturition: Secondary | ICD-10-CM

## 2014-09-23 MED ORDER — TROSPIUM CHLORIDE 20 MG PO TABS: 20.0000 mg | ORAL_TABLET | Freq: Two times a day (BID) | ORAL | Status: DC

## 2014-09-27 NOTE — Progress Notes (Signed)
Chief Complaint:  Chief Complaint  Patient presents with  . PTNS     HPI:     Patient's urinary incontinence began several years ago resulting in the patient undergoing a bladder sling procedure in 2008.   She does not recall a specific event that led to the urinary incontinence. Her amount of leakage is variable. She leaks slightly with laughing, straining, and or coughing. She leaks larger amounts when she experiences an urge to void. Her leakage can occur at any time.      After 8 treatments with the PTNS, she had worsening of her urinary symptoms.  Today, she presents for her 12th PTNS.  Over the last week, she had 0 episode of urinary incontinence. She had daytime frequency of 8 trips to the bathroom while awake. She experienced 4 nocturia  over the last week. She is not experiencing any pain or burning with urination.   She only has 1 caffeine beverage daily and does not consume any alcohol.                      Previous Therapy:     She is doing Keg[employer] at home on her own.                                Anticholinergics        Patient was started on Vesicare 5 mg daily and then increased to 10 mg daily in December 2015. Unfortunately, she suffered memory loss 2 months later and had to discontinue the Vesicare.     Other previous therapy          Patient is currently on 50 mg of Myrbetriq daily.  She is also on Macrobid 100 mg 1 capsule daily.  Blood pressure 133/86, pulse 76, height 5\' 2"  (1.575 m), weight 142 lb (64.411 kg).  Patient has no contraindications present for PTNS therapy.  She denied having a pacemaker, an implantable defibrillator, history of abnormal bleeding and/or history of neuropath these or nerve damage.    Patient goals:     Patient's goals for PTNS are reduced urinary urgency and reduced frequency  PTNS treatment:     The patient's left ankle is cleansed with rubbing alcohol. The needle electrode was inserted into the lower, inner aspect of patient's leg. A  surface electrode was placed on the inside arch of the foot on the treatment leg. The lead set was connected to the stimulator and the needle electrode clip was connected to the needle electrode. The stimulator that produces an adjustable electrical pulse that travels to sacral nerve plexus via the tibial and nerve was increased to 9 and patient received a toe flex and sensory response to the stimulus. She tolerated the procedure well for 30 minutes.  Treatment Plan:      Overall, Mrs. Baxley has been disappointed with the PTNS therapy. She was hoping she could discontinue her Myrbetriq medication after her therapy. She had tried to discontinue the Myrbetriq a few times during her therapy, but her symptoms always worsened. She did not take the medication last week in hopes that since today would be her last weekly treatment she would see some benefit. Her symptoms worsened. PNE and Botox had been discussed with her previously during a visit with Dr. Elnoria Howard.  She was not interested in those treatment modalities at that time. But now, she would like to consider those options again.  She is more interested in the Botox therapy. She would like an appointment with Dr. Matilde Sprang when they become available in our [place] office.  She will continue her monthly PTNS in the interim.

## 2014-09-30 ENCOUNTER — Ambulatory Visit: Payer: Self-pay | Admitting: Urology

## 2014-10-06 ENCOUNTER — Ambulatory Visit: Payer: Self-pay | Admitting: Obstetrics and Gynecology

## 2014-10-12 DIAGNOSIS — I10 Essential (primary) hypertension: Secondary | ICD-10-CM | POA: Insufficient documentation

## 2014-10-19 ENCOUNTER — Ambulatory Visit: Payer: Medicare PPO | Admitting: Urology

## 2014-10-21 ENCOUNTER — Ambulatory Visit: Payer: Self-pay | Admitting: Obstetrics and Gynecology

## 2014-11-16 ENCOUNTER — Encounter: Payer: Self-pay | Admitting: Urology

## 2014-11-18 ENCOUNTER — Encounter: Payer: Self-pay | Admitting: Urology

## 2014-12-01 ENCOUNTER — Encounter: Payer: Self-pay | Admitting: Urology

## 2014-12-02 NOTE — Telephone Encounter (Signed)
Will confirm appt and call patient and let her know myrbetriq is up front for pick up.

## 2014-12-15 ENCOUNTER — Ambulatory Visit: Payer: Medicare PPO

## 2014-12-21 ENCOUNTER — Ambulatory Visit (INDEPENDENT_AMBULATORY_CARE_PROVIDER_SITE_OTHER): Payer: Medicare PPO | Admitting: Urology

## 2014-12-21 ENCOUNTER — Encounter: Payer: Self-pay | Admitting: Urology

## 2014-12-21 VITALS — BP 121/73 | HR 79 | Ht 62.0 in | Wt 143.4 lb

## 2014-12-21 DIAGNOSIS — R350 Frequency of micturition: Secondary | ICD-10-CM | POA: Diagnosis not present

## 2014-12-21 DIAGNOSIS — N3000 Acute cystitis without hematuria: Secondary | ICD-10-CM

## 2014-12-21 LAB — URINALYSIS, COMPLETE
Bilirubin, UA: NEGATIVE
Glucose, UA: NEGATIVE
Ketones, UA: NEGATIVE
Nitrite, UA: NEGATIVE
Specific Gravity, UA: 1.02 (ref 1.005–1.030)
Urobilinogen, Ur: 0.2 mg/dL (ref 0.2–1.0)
pH, UA: 6.5 (ref 5.0–7.5)

## 2014-12-21 LAB — MICROSCOPIC EXAMINATION

## 2014-12-21 LAB — BLADDER SCAN AMB NON-IMAGING

## 2014-12-21 NOTE — Progress Notes (Signed)
12/21/2014 3:07 PM   Marie Fisher February 14, 1946 [id number]  Referring provider: Idelle Crouch, MD [place], [place] [id number]  Chief Complaint  Patient presents with  . Urinary Frequency    discuss options    HPI: Ms. Marie Fisher primary complaint is urinary frequency and nocturia. Not on medication she voids every 90 minutes gets up 4 times a night. On mybetriq she gets up once a night and voids every 2 hours. She has rare urge incontinence is not wearing a pad. She is no enuresis or stress incontinence  She's had 2 TIAs and 2 neck operations. She's not had a hysterectomy. She truly believes that the medication is affecting her memory. She is failed PTNS with only minimal improvement  She is infection free on daily Macrodantin and used to get urinary tract infections every 2 months  Modifying factors: There are no other modifying factors  Associated signs and symptoms: There are no other associated signs and symptoms Aggravating and relieving factors: There are no other aggravating or relieving factors Severity: Moderate Duration: Persistent    PMH: Past Medical History  Diagnosis Date  . Allergy   . Anemia   . Depression   . Arthritis   . Cardiomyopathy   . Stroke 2010    mini  . Hyperlipidemia 08/10/2014  . GERD (gastroesophageal reflux disease) 08/10/2014  . Asthma   . Fibrocystic breast 08/10/2014  . Hypertension   . 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 Urinary tract infection     recurrent  . Vaginal atrophy 08/10/2014  . 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 OAB (overactive bladder) 08/10/2014    Surgical History: Past Surgical History  Procedure Laterality Date  . Cervical spine surgery  1990  . Neck surgery      from front  . Carpal tunnel release      bil.  . Breast biopsy      x3  . Bladder surgery    . Ablation N/A     Cardiac    Home Medications:    Medication List       This list  is accurate as of: 12/21/14  3:07 PM.  Always use your most recent med list.               aspirin 81 MG tablet  Take 81 mg by mouth daily.     BIO-D-MULSION 400 UNT/0.03ML Liqd  Generic drug:  cholecalciferol  Take 0.06 mL by mouth daily.     bisoprolol 5 MG tablet  Commonly known as:  ZEBETA     buPROPion 300 MG 24 hr tablet  Commonly known as:  WELLBUTRIN XL  Take 300 mg by mouth daily.     candesartan 32 MG tablet  Commonly known as:  ATACAND  Take 32 mg by mouth daily.     CARBO GRABBER PO  Take by mouth.     Cranberry 500 MG Chew  Chew by mouth.     [employer] PROBIOTIC MAXIMUM STRENGTH PO  Take by mouth.     DHA PO  Take 4 tablets by mouth daily.     DHEA 25 MG Caps  Take 5 mg by mouth.     docusate sodium 100 MG capsule  Commonly known as:  COLACE  Take 100 mg by mouth.     estradiol 0.1 MG/GM vaginal cream  Commonly known as:  ESTRACE  Place vaginally.     EVENING PRIMROSE OIL  PO  Take by mouth.     liothyronine 25 MCG tablet  Commonly known as:  CYTOMEL     Methylcobalamin 5000 MCG Tbdp  Take by mouth.     mirabegron ER 25 MG Tb24 tablet  Commonly known as:  MYRBETRIQ  Take 25 mg by mouth daily.     NAC PO  Take 1 tablet by mouth daily.     nitrofurantoin 100 MG capsule  Commonly known as:  MACRODANTIN     NON FORMULARY  4 tablets daily. Biotagen     PHOSPHATIDYL CHOLINE PO  Take by mouth.     Saw Palmetto 160 MG Caps  Take 320 mg by mouth.     SUMAtriptan 20 MG/ACT nasal spray  Commonly known as:  IMITREX  1 Inhaler every two (2) hours as needed.     UNABLE TO FIND  30 mg daily. Med Name: Boro tab - DR V     UNABLE TO FIND  daily. Med Name: Catalyn ( [id number])JS Dr. Coralie Common TO FIND  4 tablets daily. Med Name: Lipotropix     UNABLE TO FIND  5,000 mcg 2 (two) times daily. Med Name: omega pure 820- DR V     UNABLE TO FIND  2 tablets daily. Med Name: Sharmaine Base     valACYclovir 500 MG tablet  Commonly known as:   VALTREX  Take 500 mg by mouth 2 (two) times daily.     vitamin C 500 MG tablet  Commonly known as:  ASCORBIC ACID  Take 500 mg by mouth daily.        Allergies:  Allergies  Allergen Reactions  . Other Itching    Dust       Family History: Family History  Problem Relation Age of Onset  . Colon cancer Neg Hx   . Stomach cancer Neg Hx   . Diabetes Mellitus I Father   . Heart disease Father   . Heart disease Mother   . Diabetes Mellitus I Paternal Aunt   . Breast cancer Paternal Grandmother   . Breast cancer Maternal Grandmother   . Diabetes Mellitus I Maternal Grandfather     Social History:  reports that she has never smoked. She has never used smokeless tobacco. She reports that she does not drink alcohol or use illicit drugs.  ROS: UROLOGY Frequent Urination?: Yes Hard to postpone urination?: No Burning/pain with urination?: No Get up at night to urinate?: Yes Leakage of urine?: No Urine stream starts and stops?: No Trouble starting stream?: No Do you have to strain to urinate?: No Blood in urine?: No Urinary tract infection?: No Sexually transmitted disease?: No Injury to kidneys or bladder?: No Painful intercourse?: No Weak stream?: No Currently pregnant?: No Vaginal bleeding?: No Last menstrual period?: n  Gastrointestinal Nausea?: No Vomiting?: No Indigestion/heartburn?: No Diarrhea?: No Constipation?: No  Constitutional Fever: No Night sweats?: No Weight loss?: No Fatigue?: No  Skin Skin rash/lesions?: No Itching?: No  Eyes Blurred vision?: No Double vision?: No  Ears/Nose/Throat Sore throat?: No Sinus problems?: No  Hematologic/Lymphatic Swollen glands?: No Easy bruising?: No  Cardiovascular Leg swelling?: No Chest pain?: No  Respiratory Cough?: No Shortness of breath?: No  Endocrine Excessive thirst?: No  Musculoskeletal Back pain?: No Joint pain?: No  Neurological Headaches?: No Dizziness?:  No  Psychologic Depression?: No Anxiety?: No  Physical Exam: BP 121/73 mmHg  Pulse 79  Ht 5\' 2"  (1.575 m)  Wt 143 lb 6.4 oz (  65.046 kg)  BMI 26.22 kg/m2  LMP  (Exact Date)  Constitutional:  Alert and oriented, No acute distress. HEENT: [place] AT, moist mucus membranes.  Trachea midline, no masses. Cardiovascular: No clubbing, cyanosis, or edema. Respiratory: Normal respiratory effort, no increased work of breathing. GI: Abdomen is soft, nontender, nondistended, no abdominal masses GU: No CVA tenderness. Grade 2 hypomobility of the bladder neck and negative cough test. Grade 1 cystocele. Asymptomatic grade 2 rectocele Skin: No rashes, bruises or suspicious lesions. Lymph: No cervical or inguinal adenopathy. Neurologic: Grossly intact, no focal deficits, moving all 4 extremities. Psychiatric: Normal mood and affect.  Laboratory Data: No results found for: WBC, HGB, HCT, MCV, PLT    No results found for: HGBA1C  Urinalysis No results found for: COLORURINE, APPEARANCEUR, LABSPEC, PHURINE, GLUCOSEU, HGBUR, BILIRUBINUR, KETONESUR, PROTEINUR, UROBILINOGEN, NITRITE, LEUKOCYTESUR  Pertinent Imaging: None  Assessment & Plan:  Ms. Obeid primarily has urinary frequency and nocturia and it appears she's having side effects from myrbetriq which would be rare. Role of urodynamics discussed. Role of cystoscopy discussed. She lives in [place]. 1. Urinary frequency 2. Urge incontinence 3. Recurrent urinary tract infection - Urinalysis, Complete - BLADDER SCAN AMB NON-IMAGING   No Follow-up on file.  Reece Packer, MD  [facility] [address] [place],  [address] [phone]

## 2014-12-24 DIAGNOSIS — E039 Hypothyroidism, unspecified: Secondary | ICD-10-CM | POA: Insufficient documentation

## 2014-12-30 ENCOUNTER — Encounter: Payer: Self-pay | Admitting: Urology

## 2014-12-30 NOTE — Telephone Encounter (Signed)
Per Larene Beach ok to give samples until appointment. Spoke with patient and let her know samples up front.

## 2015-01-06 ENCOUNTER — Ambulatory Visit: Payer: Medicare PPO

## 2015-01-12 ENCOUNTER — Encounter: Payer: Medicare PPO | Admitting: Obstetrics and Gynecology

## 2015-01-13 ENCOUNTER — Ambulatory Visit: Payer: Medicare PPO

## 2015-03-04 ENCOUNTER — Encounter: Payer: Medicare PPO | Admitting: Obstetrics and Gynecology

## 2015-03-18 ENCOUNTER — Encounter: Payer: Medicare PPO | Admitting: Obstetrics and Gynecology

## 2015-04-15 ENCOUNTER — Encounter: Payer: Medicare PPO | Admitting: Obstetrics and Gynecology

## 2015-05-19 ENCOUNTER — Encounter: Payer: Medicare PPO | Admitting: Obstetrics and Gynecology

## 2015-06-17 ENCOUNTER — Emergency Department (HOSPITAL_COMMUNITY): Payer: Medicare Other

## 2015-06-17 ENCOUNTER — Encounter: Payer: Medicare PPO | Admitting: Obstetrics and Gynecology

## 2015-06-17 ENCOUNTER — Observation Stay (HOSPITAL_COMMUNITY)
Admission: EM | Admit: 2015-06-17 | Discharge: 2015-06-18 | Disposition: A | Discharge status: Home / Self Care | Payer: Medicare Other | Attending: Internal Medicine | Admitting: Internal Medicine

## 2015-06-17 ENCOUNTER — Encounter (HOSPITAL_COMMUNITY): Payer: Self-pay | Admitting: Emergency Medicine

## 2015-06-17 DIAGNOSIS — Z8673 Personal history of transient ischemic attack (TIA), and cerebral infarction without residual deficits: Secondary | ICD-10-CM | POA: Insufficient documentation

## 2015-06-17 DIAGNOSIS — F329 Major depressive disorder, single episode, unspecified: Secondary | ICD-10-CM | POA: Diagnosis not present

## 2015-06-17 DIAGNOSIS — I10 Essential (primary) hypertension: Secondary | ICD-10-CM | POA: Diagnosis not present

## 2015-06-17 DIAGNOSIS — I959 Hypotension, unspecified: Secondary | ICD-10-CM | POA: Insufficient documentation

## 2015-06-17 DIAGNOSIS — I429 Cardiomyopathy, unspecified: Secondary | ICD-10-CM | POA: Insufficient documentation

## 2015-06-17 DIAGNOSIS — K219 Gastro-esophageal reflux disease without esophagitis: Secondary | ICD-10-CM | POA: Insufficient documentation

## 2015-06-17 DIAGNOSIS — R0602 Shortness of breath: Secondary | ICD-10-CM

## 2015-06-17 DIAGNOSIS — Z7982 Long term (current) use of aspirin: Secondary | ICD-10-CM | POA: Diagnosis not present

## 2015-06-17 DIAGNOSIS — I471 Supraventricular tachycardia: Secondary | ICD-10-CM | POA: Insufficient documentation

## 2015-06-17 DIAGNOSIS — I519 Heart disease, unspecified: Secondary | ICD-10-CM | POA: Diagnosis present

## 2015-06-17 DIAGNOSIS — E785 Hyperlipidemia, unspecified: Secondary | ICD-10-CM | POA: Insufficient documentation

## 2015-06-17 DIAGNOSIS — R079 Chest pain, unspecified: Secondary | ICD-10-CM | POA: Diagnosis present

## 2015-06-17 LAB — I-STAT CHEM 8, ED
BUN: 29 mg/dL — ABNORMAL HIGH (ref 6–20)
Calcium, Ion: 1.25 mmol/L (ref 1.13–1.30)
Chloride: 100 mmol/L — ABNORMAL LOW (ref 101–111)
Creatinine, Ser: 0.9 mg/dL (ref 0.44–1.00)
Glucose, Bld: 104 mg/dL — ABNORMAL HIGH (ref 65–99)
HCT: 41 % (ref 36.0–46.0)
Hemoglobin: 13.9 g/dL (ref 12.0–15.0)
Potassium: 5 mmol/L (ref 3.5–5.1)
Sodium: 140 mmol/L (ref 135–145)
TCO2: 31 mmol/L (ref 0–100)

## 2015-06-17 LAB — COMPREHENSIVE METABOLIC PANEL
ALT: 42 U/L (ref 14–54)
AST: 36 U/L (ref 15–41)
Albumin: 4 g/dL (ref 3.5–5.0)
Alkaline Phosphatase: 41 U/L (ref 38–126)
Anion gap: 11 (ref 5–15)
BUN: 22 mg/dL — ABNORMAL HIGH (ref 6–20)
CO2: 27 mmol/L (ref 22–32)
Calcium: 10.1 mg/dL (ref 8.9–10.3)
Chloride: 102 mmol/L (ref 101–111)
Creatinine, Ser: 0.97 mg/dL (ref 0.44–1.00)
GFR calc Af Amer: >60 mL/min (ref >60)
GFR calc non Af Amer: 59 mL/min — ABNORMAL LOW (ref >60)
Glucose, Bld: 113 mg/dL — ABNORMAL HIGH (ref 65–99)
Potassium: 5.3 mmol/L — ABNORMAL HIGH (ref 3.5–5.1)
Sodium: 140 mmol/L (ref 135–145)
Total Bilirubin: 0.4 mg/dL (ref 0.3–1.2)
Total Protein: 6.5 g/dL (ref 6.5–8.1)

## 2015-06-17 LAB — TROPONIN I
Troponin I: <0.03 ng/mL (ref <0.031)
Troponin I: <0.03 ng/mL (ref <0.031)

## 2015-06-17 LAB — CBC
HCT: 35.9 % — ABNORMAL LOW (ref 36.0–46.0)
Hemoglobin: 12.1 g/dL (ref 12.0–15.0)
MCH: 30.9 pg (ref 26.0–34.0)
MCHC: 33.7 g/dL (ref 30.0–36.0)
MCV: 91.6 fL (ref 78.0–100.0)
Platelets: 236 10*3/uL (ref 150–400)
RBC: 3.92 MIL/uL (ref 3.87–5.11)
RDW: 12.5 % (ref 11.5–15.5)
WBC: 6 10*3/uL (ref 4.0–10.5)

## 2015-06-17 LAB — I-STAT TROPONIN, ED: Troponin i, poc: 0.01 ng/mL (ref 0.00–0.08)

## 2015-06-17 MED ORDER — LIOTHYRONINE SODIUM 5 MCG PO TABS
12.5000 ug | ORAL_TABLET | Freq: Two times a day (BID) | ORAL | Status: DC
  Administered 2015-06-17: 12.5 ug via ORAL

## 2015-06-17 MED ORDER — VITAMIN C 500 MG PO TABS
500.0000 mg | ORAL_TABLET | Freq: Every day | ORAL | Status: DC
  Administered 2015-06-17 – 2015-06-18 (×2): 500 mg via ORAL
  Filled 2015-06-17 (×2): qty 1

## 2015-06-17 MED ORDER — ONDANSETRON HCL 4 MG/2ML IJ SOLN: 4.0000 mg | Freq: Four times a day (QID) | INTRAMUSCULAR | Status: DC | PRN

## 2015-06-17 MED ORDER — BUPROPION HCL ER (XL) 300 MG PO TB24
300.0000 mg | ORAL_TABLET | Freq: Every day | ORAL | Status: DC
  Administered 2015-06-17 – 2015-06-18 (×2): 300 mg via ORAL
  Filled 2015-06-17 (×3): qty 1

## 2015-06-17 MED ORDER — NITROGLYCERIN 0.4 MG SL SUBL: 0.4000 mg | SUBLINGUAL_TABLET | SUBLINGUAL | Status: DC | PRN

## 2015-06-17 MED ORDER — OMEGA-3-ACID ETHYL ESTERS 1 G PO CAPS
1.0000 g | ORAL_CAPSULE | Freq: Two times a day (BID) | ORAL | Status: DC
  Administered 2015-06-17 – 2015-06-18 (×2): 1 g via ORAL
  Filled 2015-06-17 (×2): qty 1

## 2015-06-17 MED ORDER — ASPIRIN 81 MG PO CHEW
81.0000 mg | CHEWABLE_TABLET | Freq: Every day | ORAL | Status: DC
  Administered 2015-06-17 – 2015-06-18 (×2): 81 mg via ORAL
  Filled 2015-06-17 (×3): qty 1

## 2015-06-17 MED ORDER — BISOPROLOL FUMARATE 5 MG PO TABS
5.0000 mg | ORAL_TABLET | Freq: Every day | ORAL | Status: DC
  Administered 2015-06-17 – 2015-06-18 (×2): 5 mg via ORAL
  Filled 2015-06-17 (×2): qty 1

## 2015-06-17 MED ORDER — ASPIRIN 81 MG PO CHEW: 324.0000 mg | CHEWABLE_TABLET | Freq: Once | ORAL | Status: DC

## 2015-06-17 MED ORDER — ESTRADIOL 0.1 MG/GM VA CREA
1.0000 | TOPICAL_CREAM | Freq: Every day | VAGINAL | Status: DC
  Filled 2015-06-17: qty 42.5

## 2015-06-17 MED ORDER — ONDANSETRON HCL 4 MG/2ML IJ SOLN: 4.0000 mg | Freq: Three times a day (TID) | INTRAMUSCULAR | Status: AC | PRN

## 2015-06-17 MED ORDER — ACETAMINOPHEN 325 MG PO TABS: 650.0000 mg | ORAL_TABLET | ORAL | Status: DC | PRN

## 2015-06-17 MED ORDER — LIOTHYRONINE SODIUM 5 MCG PO TABS
25.0000 ug | ORAL_TABLET | Freq: Every day | ORAL | Status: DC
  Filled 2015-06-17: qty 5

## 2015-06-17 MED ORDER — VALACYCLOVIR HCL 500 MG PO TABS
500.0000 mg | ORAL_TABLET | Freq: Two times a day (BID) | ORAL | Status: DC
  Administered 2015-06-17 – 2015-06-18 (×2): 500 mg via ORAL
  Filled 2015-06-17 (×2): qty 1

## 2015-06-17 MED ORDER — IRBESARTAN 300 MG PO TABS
300.0000 mg | ORAL_TABLET | Freq: Every day | ORAL | Status: DC
  Administered 2015-06-17 – 2015-06-18 (×2): 300 mg via ORAL
  Filled 2015-06-17 (×2): qty 1

## 2015-06-17 MED ORDER — SODIUM CHLORIDE 0.9 % IV SOLN
INTRAVENOUS | Status: DC
  Administered 2015-06-17: 22:00:00 via INTRAVENOUS

## 2015-06-17 MED ORDER — ENOXAPARIN SODIUM 40 MG/0.4ML ~~LOC~~ SOLN
40.0000 mg | SUBCUTANEOUS | Status: DC
  Administered 2015-06-17: 40 mg via SUBCUTANEOUS
  Filled 2015-06-17: qty 0.4

## 2015-06-17 MED ORDER — DHA 200 MG PO CAPS: ORAL_CAPSULE | Freq: Every day | ORAL | Status: DC

## 2015-06-17 NOTE — Progress Notes (Signed)
Patient is in bed, received from ED. Patient is alert and oriented. No complaints of pain. Patient oriented to room and and call bell within reach. Telemetry placed. CCMD called. Call verified by tech. Will continue to monitor.  Cyndia Bent

## 2015-06-17 NOTE — ED Provider Notes (Signed)
CSN: [id number]     Arrival date & time 06/17/15  1310 History   First MD Initiated Contact with Patient 06/17/15 1337     Chief Complaint  Patient presents with  . Chest Pain     (Consider location/radiation/quality/duration/timing/severity/associated sxs/prior Treatment) Patient is a 70 y.o. female presenting with chest pain. The history is provided by the patient.  Chest Pain  According to the patient she has a known history of a cardiomyopathy. She presents from her doctor's office after having intermittent chest pain, left arm and shoulder pain over the last couple of days. She reports that this gets worse at night but over the last month she has had some increased dyspnea and pain on exertion with activities as simple as vacuuming the floor in her home. She denies any coughing fevers or swelling of the lower extremities. She was seen initially on Monday at her doctor's office when she was found to be hypertensive, they changed her medications stopping spironolactone and starting hydrochlorothiazide area she went back today for recheck and when they did an EKG they found some abnormal findings and said she has been having some chest and shoulder discomfort they sent her to the emergency department for evaluation. She denies having any history of obstructive coronary disease, she has had multiple ablation procedures the most recent was 8 years ago.   Past Medical History  Diagnosis Date  . Allergy   . Anemia   . Depression   . Arthritis   . Cardiomyopathy ([place])   . Stroke ([place]) 2010    mini  . Hyperlipidemia 08/10/2014  . GERD (gastroesophageal reflux disease) 08/10/2014  . Asthma   . Fibrocystic breast 08/10/2014  . Hypertension   . 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 Urinary tract infection     recurrent  . Vaginal atrophy 08/10/2014  . 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 OAB (overactive bladder) 08/10/2014   Past Surgical History  Procedure  Laterality Date  . Cervical spine surgery  1990  . Neck surgery      from front  . Carpal tunnel release      bil.  . Breast biopsy      x3  . Bladder surgery    . Ablation N/A     Cardiac   Family History  Problem Relation Age of Onset  . Colon cancer Neg Hx   . Stomach cancer Neg Hx   . Diabetes Mellitus I Father   . Heart disease Father   . Heart disease Mother   . Diabetes Mellitus I Paternal Aunt   . Breast cancer Paternal Grandmother   . Breast cancer Maternal Grandmother   . Diabetes Mellitus I Maternal Grandfather    Social History  Substance Use Topics  . Smoking status: Never Smoker   . Smokeless tobacco: Never Used  . Alcohol Use: No   OB History    No data available     Review of Systems  Cardiovascular: Positive for chest pain.  All other systems reviewed and are negative.     Allergies  Other  Home Medications   Prior to Admission medications   Medication Sig Start Date End Date Taking? Authorizing Provider  Acetylcysteine (NAC PO) Take 1 tablet by mouth daily.    Historical Provider, MD  aspirin 81 MG tablet Take 81 mg by mouth daily.    Historical Provider, MD  bisoprolol (ZEBETA) 5 MG tablet  09/10/14   Historical Provider, MD  buPROPion (WELLBUTRIN XL) 300 MG 24 hr tablet Take 300 mg by mouth daily.    Historical Provider, MD  candesartan (ATACAND) 32 MG tablet Take 32 mg by mouth daily.    Historical Provider, MD  cholecalciferol (BIO-D-MULSION) 400 UNT/0.03ML LIQD Take 0.06 mL by mouth daily.    Historical Provider, MD  Cranberry 500 MG CHEW Chew by mouth.    Historical Provider, MD  DHEA 25 MG CAPS Take 5 mg by mouth.    Historical Provider, MD  Docosahexaenoic Acid (DHA PO) Take 4 tablets by mouth daily.    Historical Provider, MD  docusate sodium (COLACE) 100 MG capsule Take 100 mg by mouth.    Historical Provider, MD  estradiol (ESTRACE) 0.1 MG/GM vaginal cream Place vaginally.    Historical Provider, MD  EVENING PRIMROSE OIL PO Take  by mouth.    Historical Provider, MD  liothyronine (CYTOMEL) 25 MCG tablet  08/05/14   Historical Provider, MD  Methylcobalamin 5000 MCG TBDP Take by mouth.    Historical Provider, MD  mirabegron ER (MYRBETRIQ) 25 MG TB24 tablet Take 25 mg by mouth daily.    Historical Provider, MD  Misc Natural Products (CARBO GRABBER PO) Take by mouth.    Historical Provider, MD  nitrofurantoin (MACRODANTIN) 100 MG capsule  08/19/14   Historical Provider, MD  NON FORMULARY 4 tablets daily. [place] Provider, MD  PHOSPHATIDYL CHOLINE PO Take by mouth.    Historical Provider, MD  Probiotic Product ([employer] PROBIOTIC MAXIMUM STRENGTH PO) Take by mouth.    Historical Provider, MD  Saw Palmetto 160 MG CAPS Take 320 mg by mouth.    Historical Provider, MD  SUMAtriptan (IMITREX) 20 MG/ACT nasal spray 1 Inhaler every two (2) hours as needed. 12/12/06   Historical Provider, MD  UNABLE TO FIND 30 mg daily. Med Name: Boro tab - DR V    Historical Provider, MD  UNABLE TO FIND daily. Med Name: Catalyn [id number] Dr. Patrice Paradise    Historical Provider, MD  UNABLE TO FIND 4 tablets daily. Med Name: Lipotropix    Historical Provider, MD  UNABLE TO FIND 5,000 mcg 2 (two) times daily. Med Name: omega pure 20- DR V    Historical Provider, MD  UNABLE TO FIND 2 tablets daily. Med Name: Osteosheath-DR V    Historical Provider, MD  valACYclovir (VALTREX) 500 MG tablet Take 500 mg by mouth 2 (two) times daily.    Historical Provider, MD  vitamin C (ASCORBIC ACID) 500 MG tablet Take 500 mg by mouth daily.    Historical Provider, MD   BP 128/71 mmHg  Pulse 72  Temp(Src) 98.4 F (36.9 C) (Oral)  Resp 20  Ht 5' 2.5" (1.588 m)  Wt 137 lb (62.143 kg)  BMI 24.64 kg/m2  SpO2 97%  LMP  (Exact Date) Physical Exam  Constitutional: She appears well-developed and well-nourished. No distress.  HENT:  Head: Normocephalic and atraumatic.  Mouth/Throat: Oropharynx is clear and moist. No oropharyngeal exudate.  Eyes: Conjunctivae and EOM  are normal. Pupils are equal, round, and reactive to light. Right eye exhibits no discharge. Left eye exhibits no discharge. No scleral icterus.  Neck: Normal range of motion. Neck supple. No JVD present. No thyromegaly present.  Cardiovascular: Normal rate, regular rhythm, normal heart sounds and intact distal pulses.  Exam reveals no gallop and no friction rub.   No murmur heard. Pulmonary/Chest: Effort normal and breath sounds normal. No respiratory distress. She has no wheezes. She has no  rales.  Abdominal: Soft. Bowel sounds are normal. She exhibits no distension and no mass. There is no tenderness.  Musculoskeletal: Normal range of motion. She exhibits no edema or tenderness.  Lymphadenopathy:    She has no cervical adenopathy.  Neurological: She is alert. Coordination normal.  Skin: Skin is warm and dry. No rash noted. No erythema.  Psychiatric: She has a normal mood and affect. Her behavior is normal.  Nursing note and vitals reviewed.   ED Course  Procedures (including critical care time) Labs Review Labs Reviewed  CBC - Abnormal; Notable for the following:    HCT 35.9 (*)    All other components within normal limits  COMPREHENSIVE METABOLIC PANEL - Abnormal; Notable for the following:    Potassium 5.3 (*)    Glucose, Bld 113 (*)    BUN 22 (*)    GFR calc non Af Amer 59 (*)    All other components within normal limits  I-STAT CHEM 8, ED - Abnormal; Notable for the following:    Chloride 100 (*)    BUN 29 (*)    Glucose, Bld 104 (*)    All other components within normal limits  I-STAT TROPOININ, ED    Imaging Review Dg Chest Portable 1 View  06/17/2015  CLINICAL DATA:  Cardiac arrhythmia.  Hypertension. EXAM: PORTABLE CHEST 1 VIEW COMPARISON:  June 09, 2003 FINDINGS: No edema or consolidation. Heart is upper normal in size with pulmonary vascularity within normal limits. No adenopathy. There is postoperative change in the lower cervical region. IMPRESSION: No edema or  consolidation. Electronically Signed   By: Lowella Grip III M.D.   On: 06/17/2015 13:53   I have personally reviewed and evaluated these images and lab results as part of my medical decision-making.   EKG Interpretation   Date/Time:  Thursday June 17 2015 13:14:43 EDT Ventricular Rate:  75 PR Interval:  146 QRS Duration: 116 QT Interval:  393 QTC Calculation: 439 R Axis:   -14 Text Interpretation:  Sinus rhythm Multiform ventricular premature  complexes Nonspecific intraventricular conduction delay Borderline  repolarization abnormality Nonspecific ST abnormality Abnormal ekg Since  last tracing rate slower and ectopy now present Confirmed by Lakeia Bradshaw  MD,  Kelsie Zaborowski ([id number]) on 06/17/2015 2:33:48 PM      MDM   Final diagnoses:  Chest pain, unspecified chest pain type    The patient has no fever, no tachycardia, no hypoxia and normal blood pressure other than mild hypertension. She has symptoms that are concerning for progressive lesions, she has no history of coronary disease but I suspect that she does have some underlying disease that has not yet been evaluated or diagnosed. Her EKG here does have some nonspecific ST segments, the one from the office which was brought with the patient also has similar ST abnormalities which could be concerning for obstructive lesions. The patient is in agreement with admission to the hospital, labs pending.    The labs are unremarkable - the pt has some concern for angina / unstable angina - will admit  D/w hospitalit who requests observational admission.  Noemi Chapel, MD 06/17/15 [phone]

## 2015-06-17 NOTE — ED Notes (Signed)
Pt received 324 asa by EMS

## 2015-06-17 NOTE — ED Notes (Signed)
Pt here from MD office with c/o chest pain that has been ongoing for 1 week , radiation to arm  Some dizziness also

## 2015-06-17 NOTE — ED Notes (Signed)
Gluten-free heart healthy dinner tray ordered @ 17:01pm.

## 2015-06-17 NOTE — ED Notes (Signed)
Report called  

## 2015-06-17 NOTE — H&P (Addendum)
Patient Demographics  Marie Fisher, is a 70 y.o. female  MRN: [id number]   DOB - [birth year]  Admit Date - 06/17/2015  Outpatient Primary MD for the patient is SPARKS,JEFFREY D, MD   With History of -  Past Medical History  Diagnosis Date  . Allergy   . Anemia   . Depression   . Arthritis   . Cardiomyopathy ([place])   . Stroke ([place]) 2010    mini  . Hyperlipidemia 08/10/2014  . GERD (gastroesophageal reflux disease) 08/10/2014  . Asthma   . Fibrocystic breast 08/10/2014  . Hypertension   . 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 Urinary tract infection     recurrent  . Vaginal atrophy 08/10/2014  . 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 OAB (overactive bladder) 08/10/2014      Past Surgical History  Procedure Laterality Date  . Cervical spine surgery  1990  . Neck surgery      from front  . Carpal tunnel release      bil.  . Breast biopsy      x3  . Bladder surgery    . Ablation N/A     Cardiac    in for   Chief Complaint  Patient presents with  . Chest Pain     HPI  Marie Fisher  is a 70 y.o. 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, she presents to ED from PCP office for abnormal EKG, patient reports she had recent changes of her antihypertensive medication by PCP, with few episodes of hypotension, she mentioned to her few episodes of chest pain today, EKG was obtained which was reported to be of normal, for which she was asked to come to ED, patient denies any history of coronary artery disease in the past, reports last nuclear stress test was 15 years ago when she was diagnosed with viral myocarditis, currently denies any chest pain, reports occasional feeling of chest discomfort, and shoulder discomfort, EKG with minimal flattening of T waves, troponins were negative, laboratory with no significant abnormalities, I was called to  admit.    Review of Systems    In addition to the HPI above,  No Fever-chills, No Headache, No changes with Vision or hearing, No problems swallowing food or Liquids, Reports  Chest pain, denies Cough or Shortness of Breath, No Abdominal pain, No Nausea or Vommitting, Bowel movements are regular, No Blood in stool or Urine, No dysuria, No new skin rashes or bruises, No new joints pains-aches,  No new weakness, tingling, numbness in any extremity, No recent weight gain or loss, No polyuria, polydypsia or polyphagia, No significant Mental Stressors.  A full 10 point Review of Systems was done, except as stated above, all other Review of Systems were negative.   Social History Social History  Substance Use Topics  . Smoking status: Never Smoker   . Smokeless tobacco: Never Used  . Alcohol Use: No     Family History Family History  Problem Relation Age of Onset  . Colon cancer Neg Hx   . Stomach cancer Neg Hx   . Diabetes Mellitus I Father   . Heart disease Father   . Heart disease Mother   . Diabetes Mellitus I Paternal Aunt   . Breast cancer Paternal Grandmother   . Breast cancer Maternal Grandmother   . Diabetes Mellitus I Maternal Grandfather      Prior to  Admission medications   Medication Sig Start Date End Date Taking? Authorizing Provider  Acetylcysteine (NAC PO) Take 1 tablet by mouth daily.    Historical Provider, MD  aspirin 81 MG tablet Take 81 mg by mouth daily.    Historical Provider, MD  bisoprolol (ZEBETA) 5 MG tablet  09/10/14   Historical Provider, MD  buPROPion (WELLBUTRIN XL) 300 MG 24 hr tablet Take 300 mg by mouth daily.    Historical Provider, MD  candesartan (ATACAND) 32 MG tablet Take 32 mg by mouth daily.    Historical Provider, MD  cholecalciferol (BIO-D-MULSION) 400 UNT/0.03ML LIQD Take 0.06 mL by mouth daily.    Historical Provider, MD  Cranberry 500 MG CHEW Chew by mouth.    Historical Provider, MD  DHEA 25 MG CAPS Take 5 mg by mouth.     Historical Provider, MD  Docosahexaenoic Acid (DHA PO) Take 4 tablets by mouth daily.    Historical Provider, MD  docusate sodium (COLACE) 100 MG capsule Take 100 mg by mouth.    Historical Provider, MD  estradiol (ESTRACE) 0.1 MG/GM vaginal cream Place vaginally.    Historical Provider, MD  EVENING PRIMROSE OIL PO Take by mouth.    Historical Provider, MD  liothyronine (CYTOMEL) 25 MCG tablet  08/05/14   Historical Provider, MD  Methylcobalamin 5000 MCG TBDP Take by mouth.    Historical Provider, MD  mirabegron ER (MYRBETRIQ) 25 MG TB24 tablet Take 25 mg by mouth daily.    Historical Provider, MD  Misc Natural Products (CARBO GRABBER PO) Take by mouth.    Historical Provider, MD  nitrofurantoin (MACRODANTIN) 100 MG capsule  08/19/14   Historical Provider, MD  NON FORMULARY 4 tablets daily. [place] Provider, MD  PHOSPHATIDYL CHOLINE PO Take by mouth.    Historical Provider, MD  Probiotic Product ([employer]S PROBIOTIC MAXIMUM STRENGTH PO) Take by mouth.    Historical Provider, MD  Saw Palmetto 160 MG CAPS Take 320 mg by mouth.    Historical Provider, MD  SUMAtriptan (IMITREX) 20 MG/ACT nasal spray 1 Inhaler every two (2) hours as needed. 12/12/06   Historical Provider, MD  UNABLE TO FIND 30 mg daily. Med Name: Boro tab - DR V    Historical Provider, MD  UNABLE TO FIND daily. Med Name: Catalyn [phone] Dr. Patrice Paradise    Historical Provider, MD  UNABLE TO FIND 4 tablets daily. Med Name: Lipotropix    Historical Provider, MD  UNABLE TO FIND 5,000 mcg 2 (two) times daily. Med Name: omega pure 10- DR V    Historical Provider, MD  UNABLE TO FIND 2 tablets daily. Med Name: Osteosheath-DR V    Historical Provider, MD  valACYclovir (VALTREX) 500 MG tablet Take 500 mg by mouth 2 (two) times daily.    Historical Provider, MD  vitamin C (ASCORBIC ACID) 500 MG tablet Take 500 mg by mouth daily.    Historical Provider, MD    Allergies  Allergen Reactions  . Other Itching    Dust       Physical  Exam  Vitals  Blood pressure 128/71, pulse 72, temperature 98.4 F (36.9 C), temperature source Oral, resp. rate 20, height 5' 2.5" (1.588 m), weight 62.143 kg (137 lb), SpO2 97 %.   1. General elderly  lying in bed in NAD,    2. Normal affect and insight, Not Suicidal or Homicidal, Awake Alert, Oriented X 3.  3. No F.N deficits, ALL C.Nerves Intact, Strength 5/5 all 4 extremities,  Sensation intact all 4 extremities, Plantars down going.  4. Ears and Eyes appear Normal, Conjunctivae clear, PERRLA. Moist Oral Mucosa.  5. Supple Neck, No JVD, No cervical lymphadenopathy appriciated, No Carotid Bruits.  6. Symmetrical Chest wall movement, Good air movement bilaterally, CTAB.  7. RRR, No Gallops, Rubs or Murmurs, No Parasternal Heave.  8. Positive Bowel Sounds, Abdomen Soft, No tenderness, No organomegaly appriciated,No rebound -guarding or rigidity.  9.  No Cyanosis, Normal Skin Turgor, No Skin Rash or Bruise.  10. Good muscle tone,  joints appear normal , no effusions, Normal ROM.  11. No Palpable Lymph Nodes in Neck or Axillae    Data Review  CBC  Recent Labs Lab 06/17/15 1328 06/17/15 1359  WBC 6.0  --   HGB 12.1 13.9  HCT 35.9* 41.0  PLT 236  --   MCV 91.6  --   MCH 30.9  --   MCHC 33.7  --   RDW 12.5  --    ------------------------------------------------------------------------------------------------------------------  Chemistries   Recent Labs Lab 06/17/15 1328 06/17/15 1359  NA 140 140  K 5.3* 5.0  CL 102 100*  CO2 27  --   GLUCOSE 113* 104*  BUN 22* 29*  CREATININE 0.97 0.90  CALCIUM 10.1  --   AST 36  --   ALT 42  --   ALKPHOS 41  --   BILITOT 0.4  --    ------------------------------------------------------------------------------------------------------------------ estimated creatinine clearance is 52.5 mL/min (by C-G formula based on Cr of  0.9). ------------------------------------------------------------------------------------------------------------------ No results for input(s): TSH, T4TOTAL, T3FREE, THYROIDAB in the last 72 hours.  Invalid input(s): FREET3   Coagulation profile No results for input(s): INR, PROTIME in the last 168 hours. ------------------------------------------------------------------------------------------------------------------- No results for input(s): DDIMER in the last 72 hours. -------------------------------------------------------------------------------------------------------------------  Cardiac Enzymes No results for input(s): CKMB, TROPONINI, MYOGLOBIN in the last 168 hours.  Invalid input(s): CK ------------------------------------------------------------------------------------------------------------------ Invalid input(s): POCBNP   ---------------------------------------------------------------------------------------------------------------  Urinalysis    Component Value Date/Time   APPEARANCEUR Cloudy* 12/21/2014 1440   GLUCOSEU Negative 12/21/2014 1440   BILIRUBINUR Negative 12/21/2014 1440   PROTEINUR 1+* 12/21/2014 1440   NITRITE Negative 12/21/2014 1440   LEUKOCYTESUR 1+* 12/21/2014 1440    ----------------------------------------------------------------------------------------------------------------  Imaging results:   Dg Chest Portable 1 View  06/17/2015  CLINICAL DATA:  Cardiac arrhythmia.  Hypertension. EXAM: PORTABLE CHEST 1 VIEW COMPARISON:  June 09, 2003 FINDINGS: No edema or consolidation. Heart is upper normal in size with pulmonary vascularity within normal limits. No adenopathy. There is postoperative change in the lower cervical region. IMPRESSION: No edema or consolidation. Electronically Signed   By: Lowella Grip III M.D.   On: 06/17/2015 13:53    My personal review of EKG: Rhythm NSR, Rate  75 /min, QTc 439 , flattening of T waves in  anterolateral leads.    Assessment & Plan  Active Problems:   GERD (gastroesophageal reflux disease)   Supraventricular tachycardia (HCC)   Systolic dysfunction   Essential (primary) hypertension   Chest pain  Chest pain with abnormal EKG - Patient chest pain complaints is nonspecific, unrelated to nausea or a pheresis or dyspnea, EKG with mild flattening of lateral leads, but no baseline to compare. 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 - Will admit to telemetry, cycle cardiac enzymes, follow trend, check 2-D echo in a.m, continue with her aspirin, beta blockers.  Supraventricular tachycardia - Status post ablation X3, no recurrence per primary cardiologist note, monitor on telemetry, continue  with beta blockers.  HTN - Patient reports labile blood pressure recently, will continue with home medication and monitor.  History of systolic dysfunction, nonischemic cardiac myopathy EF 40-45% and 04/03/14 echo with dilated left ventricle  Depression - Continue with home medication  DVT Prophylaxis  Lovenox   AM Labs Ordered, also please review Full Orders  Family Communication: Admission, patients condition and plan of care including tests being ordered have been discussed with the patient who indicate understanding and agree with the plan and Code Status.  Code Status Full  Likely DC to  home  Condition GUARDED    Time spent in minutes : 50 minutes    ELGERGAWY, DAWOOD M.D on 06/17/2015 at 3:49 PM  Between 7am to 7pm - Pager - [phone]  After 7pm go to [email or web address] - password [email or web address]  And look for the night coverage person covering me after hours  [facility] Office  [phone]

## 2015-06-18 ENCOUNTER — Observation Stay (HOSPITAL_BASED_OUTPATIENT_CLINIC_OR_DEPARTMENT_OTHER): Payer: Medicare Other

## 2015-06-18 ENCOUNTER — Observation Stay (HOSPITAL_COMMUNITY): Payer: Medicare Other

## 2015-06-18 DIAGNOSIS — I10 Essential (primary) hypertension: Secondary | ICD-10-CM

## 2015-06-18 DIAGNOSIS — R072 Precordial pain: Secondary | ICD-10-CM | POA: Diagnosis not present

## 2015-06-18 DIAGNOSIS — R079 Chest pain, unspecified: Secondary | ICD-10-CM | POA: Diagnosis not present

## 2015-06-18 DIAGNOSIS — I519 Heart disease, unspecified: Secondary | ICD-10-CM

## 2015-06-18 DIAGNOSIS — R0602 Shortness of breath: Secondary | ICD-10-CM

## 2015-06-18 LAB — NM MYOCAR MULTI W/SPECT W/WALL MOTION / EF
Estimated workload: 1 METS
Exercise duration (min): 5 min
LV dias vol: 104 mL (ref 46–106)
LV sys vol: 50 mL
MPHR: 152 {beats}/min
Peak HR: 86 {beats}/min
Percent HR: 56 %
RATE: 1.2
Rest HR: 58 {beats}/min
SDS: 0
SRS: 9
SSS: 9
TID: 1.23

## 2015-06-18 LAB — ECHOCARDIOGRAM COMPLETE
Height: 62.5 in
Weight: 2192 oz

## 2015-06-18 LAB — TROPONIN I: Troponin I: <0.03 ng/mL (ref <0.031)

## 2015-06-18 MED ORDER — TECHNETIUM TC 99M SESTAMIBI GENERIC - CARDIOLITE
30.0000 | Freq: Once | INTRAVENOUS | Status: AC | PRN
  Administered 2015-06-18: 30 via INTRAVENOUS

## 2015-06-18 MED ORDER — TECHNETIUM TC 99M SESTAMIBI GENERIC - CARDIOLITE
10.0000 | Freq: Once | INTRAVENOUS | Status: AC | PRN
  Administered 2015-06-18: 10 via INTRAVENOUS

## 2015-06-18 MED ORDER — REGADENOSON 0.4 MG/5ML IV SOLN
0.4000 mg | Freq: Once | INTRAVENOUS | Status: AC
  Administered 2015-06-18: 0.4 mg via INTRAVENOUS
  Filled 2015-06-18: qty 5

## 2015-06-18 MED ORDER — REGADENOSON 0.4 MG/5ML IV SOLN
INTRAVENOUS | Status: AC
  Filled 2015-06-18: qty 5

## 2015-06-18 NOTE — Discharge Instructions (Signed)
Follow with Primary MD SPARKS,JEFFREY D, MD and your Cardiologist in 7 days   Get CBC, CMP, 2 view Chest X ray checked  by Primary MD next visit.    Activity: As tolerated with Full fall precautions use walker/cane & assistance as needed   Disposition Home     Diet:   Heart Healthy  .  For Heart failure patients - Check your Weight same time everyday, if you gain over 2 pounds, or you develop in leg swelling, experience more shortness of breath or chest pain, call your Primary MD immediately. Follow Cardiac Low Salt Diet and 1.5 lit/day fluid restriction.   On your next visit with your primary care physician please Get Medicines reviewed and adjusted.   Please request your Prim.MD to go over all Hospital Tests and Procedure/Radiological results at the follow up, please get all Hospital records sent to your Prim MD by signing hospital release before you go home.   If you experience worsening of your admission symptoms, develop shortness of breath, life threatening emergency, suicidal or homicidal thoughts you must seek medical attention immediately by calling 911 or calling your MD immediately  if symptoms less severe.  You Must read complete instructions/literature along with all the possible adverse reactions/side effects for all the Medicines you take and that have been prescribed to you. Take any new Medicines after you have completely understood and accpet all the possible adverse reactions/side effects.   Do not drive, operating heavy machinery, perform activities at heights, swimming or participation in water activities or provide baby sitting services if your were admitted for syncope or siezures until you have seen by Primary MD or a Neurologist and advised to do so again.  Do not drive when taking Pain medications.    Do not take more than prescribed Pain, Sleep and Anxiety Medications  Special Instructions: If you have smoked or chewed Tobacco  in the last 2 yrs please stop  smoking, stop any regular Alcohol  and or any Recreational drug use.  Wear Seat belts while driving.   Please note  You were cared for by a hospitalist during your hospital stay. If you have any questions about your discharge medications or the care you received while you were in the hospital after you are discharged, you can call the unit and asked to speak with the hospitalist on call if the hospitalist that took care of you is not available. Once you are discharged, your primary care physician will handle any further medical issues. Please note that NO REFILLS for any discharge medications will be authorized once you are discharged, as it is imperative that you return to your primary care physician (or establish a relationship with a primary care physician if you do not have one) for your aftercare needs so that they can reassess your need for medications and monitor your lab values.

## 2015-06-18 NOTE — Progress Notes (Signed)
Echocardiogram 2D Echocardiogram has been performed.  06/18/2015 11:24 AM Maudry Mayhew, RVT, RDCS, RDMS

## 2015-06-18 NOTE — Consult Note (Signed)
Cardiology Consult    Patient ID: Marie Fisher MRN: [id number], DOB/AGE: 1945/12/01   Admit date: 06/17/2015 Date of Consult: 06/18/2015  Primary Physician: Idelle Crouch, MD Reason for Consult: Chest Pain Primary Cardiologist: [facility] - Dr. Cloyd Stagers Requesting Provider: Dr. Candiss Norse   History of Present Illness    Marie Fisher is a 70 y.o. female with past medical history of nonischemic cardiomyopathy (EF 40-45% by echo in 03/2014), HTN, HLD, SVT (s/p multiple ablations), recurrent UTI's, TIA's, and depression who presented to [facility] ED on 06/17/2015 for evaluation of chest pain and hypotension.  The patient reports for the past month, she has developed worsening shortness of breath with exertion with activities such as cleaning her home and vacuuming. She was seen by her PCP for this on 06/14/2015 and was found to be hypertensive in the 170's. Her medications were adjusted and she was started on HCTZ. Later that evening, she developed a chest pressure in her left pectoral region with pain radiating down her left arm and into her left shoulder. The pain resolved after 30 minutes and was associated with diaphoresis. No nausea, vomiting, or dyspnea at that time. Denies any recent lower extremity edema, orthopnea, or PND.  Over the next few days, she became dizzy and lightheaded frequently, and was seen by her PCP yesterday and her SBP was in the 90's. HCTZ was discontinued at that time. An EKG was performed and was interpreted as "abnormal", therefore she encouraged her to go the ED for evaluation, especially with her worsening dyspnea and chest pain epoisodes.  She denies any current chest pain at this time. Cyclic troponin values have been negative. Her EKG shows NSR with multiform PVC's. WBC is 6.0. Hgb 12.1. Platelets 236. K+ 5.3 on admission. Creatinine 0.97.   In reviewing her records from [place], her last cardiac catheterization was in 08/2005 and showed mild luminal  irregularities in the LAD and RCA with no significant CAD. She has not underwent any ischemic evaluation since. Her last echocardiogram was in 04/03/14 and showed a decreased left ventricular ejection fraction of 40-45% with a dilated left ventricle, mild mitral valve prolapse, mild MR, and mild AR.    Past Medical History   Past Medical History  Diagnosis Date  . Allergy   . Anemia   . Depression   . Arthritis   . Cardiomyopathy ([place])   . Stroke ([place]) 2010    mini  . Hyperlipidemia 08/10/2014  . GERD (gastroesophageal reflux disease) 08/10/2014  . Asthma   . Fibrocystic breast 08/10/2014  . Hypertension   . 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 Urinary tract infection     recurrent  . Vaginal atrophy 08/10/2014  . 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 OAB (overactive bladder) 08/10/2014    Past Surgical History  Procedure Laterality Date  . Cervical spine surgery  1990  . Neck surgery      from front  . Carpal tunnel release      bil.  . Breast biopsy      x3  . Bladder surgery    . Ablation N/A     Cardiac     Allergies  Allergies  Allergen Reactions  . Other Itching    Dust       Inpatient Medications    . aspirin  81 mg Oral Daily  . bisoprolol  5 mg Oral Daily  . buPROPion  300 mg Oral Daily  . enoxaparin (  LOVENOX) injection  40 mg Subcutaneous Q24H  . estradiol  1 Applicatorful Vaginal Daily  . irbesartan  300 mg Oral Daily  . liothyronine  12.5 mcg Oral BID  . omega-3 acid ethyl esters  1 g Oral BID  . valACYclovir  500 mg Oral BID  . vitamin C  500 mg Oral Daily    Family History    Family History  Problem Relation Age of Onset  . Colon cancer Neg Hx   . Stomach cancer Neg Hx   . Diabetes Mellitus I Father   . Heart disease Father   . Heart disease Mother   . Diabetes Mellitus I Paternal Aunt   . Breast cancer Paternal Grandmother   . Breast cancer Maternal Grandmother   . Diabetes Mellitus I Maternal  Grandfather     Social History    Social History   Social History  . Marital Status: Divorced    Spouse Name: N/A  . Number of Children: N/A  . Years of Education: N/A   Occupational History  . Not on file.   Social History Main Topics  . Smoking status: Never Smoker   . Smokeless tobacco: Never Used  . Alcohol Use: No  . Drug Use: No  . Sexual Activity: Not on file   Other Topics Concern  . Not on file   Social History Narrative     Review of Systems    General:  No chills, fever, night sweats or weight changes.  Cardiovascular:  No edema, orthopnea, palpitations, paroxysmal nocturnal dyspnea. Positive for chest pain and dyspnea on exertion. Dermatological: No rash, lesions/masses Respiratory: No cough, Positive for dyspnea. Urologic: No hematuria, dysuria Abdominal:   No nausea, vomiting, diarrhea, bright red blood per rectum, melena, or hematemesis Neurologic:  No visual changes, wkns, changes in mental status. Positive for dizziness. All other systems reviewed and are otherwise negative except as noted above.  Physical Exam    Blood pressure 120/63, pulse 64, temperature 97.5 F (36.4 C), temperature source Oral, resp. rate 17, height 5' 2.5" (1.588 m), weight 137 lb (62.143 kg), SpO2 96 %.  General: Pleasant, NAD Psych: Normal affect. Neuro: Alert and oriented X 3. Moves all extremities spontaneously. HEENT: Normal  Neck: Supple without bruits or JVD. Lungs:  Resp regular and unlabored, CTA. Heart: RRR no s3, s4, or murmurs. Abdomen: Soft, non-tender, non-distended, BS + x 4.  Extremities: No clubbing, cyanosis or edema. DP/PT/Radials 2+ and equal bilaterally.  Labs    Troponin [facility] Test)  Recent Labs  06/17/15 1357  TROPIPOC 0.01    Recent Labs  06/17/15 2015 06/17/15 2155 06/18/15 0158  TROPONINI <0.03 <0.03 <0.03   Lab Results  Component Value Date   WBC 6.0 06/17/2015   HGB 13.9 06/17/2015   HCT 41.0 06/17/2015   MCV 91.6  06/17/2015   PLT 236 06/17/2015    Recent Labs Lab 06/17/15 1328 06/17/15 1359  NA 140 140  K 5.3* 5.0  CL 102 100*  CO2 27  --   BUN 22* 29*  CREATININE 0.97 0.90  CALCIUM 10.1  --   PROT 6.5  --   BILITOT 0.4  --   ALKPHOS 41  --   ALT 42  --   AST 36  --   GLUCOSE 113* 104*   No results found for: CHOL, HDL, LDLCALC, TRIG No results found for: [facility]   Radiology Studies    Dg Chest Portable 1 View: 06/17/2015  CLINICAL DATA:  Cardiac arrhythmia.  Hypertension. EXAM: PORTABLE CHEST 1 VIEW COMPARISON:  June 09, 2003 FINDINGS: No edema or consolidation. Heart is upper normal in size with pulmonary vascularity within normal limits. No adenopathy. There is postoperative change in the lower cervical region. IMPRESSION: No edema or consolidation. Electronically Signed   By: Lowella Grip III M.D.   On: 06/17/2015 13:53    EKG & Cardiac Imaging    EKG: NSR, HR 75, multiform PVC's.  Echocardiogram: 06/18/2015 Study Conclusions - Left ventricle: The cavity size was normal. Wall thickness was  normal. Systolic function was normal. The estimated ejection  fraction was in the range of 55% to 60%. Wall motion was normal;  there were no regional wall motion abnormalities. Doppler  parameters are consistent with abnormal left ventricular  relaxation (grade 1 diastolic dysfunction). - Aortic valve: Trileaflet; mildly thickened leaflets. There was  mild regurgitation.  Assessment & Plan    1. Chest Pain/ Dyspnea on Exertion - reports worsening dyspnea and diaphoresis over the past month with activities such as cleaning her home with no associated chest pain.  - developed chest pressure at rest on 06/14/2015 with pain radiating down her left arm and into her left shoulder and was dizzy and diaphoretic at the time. SBP was in the 90's, for she had just started taking HCTZ earlier that day. - Cyclic troponin values have been negative. EKG shows NSR with multiform PVC's but no  acute ST or T-wave changes.  - last cardiac catheterization was in 08/2005 and showed mild luminal irregularities in the LAD and RCA with no significant CAD. She has not underwent any ischemic evaluation since. - a repeat echo this admission shows improved EF of 55-60% with no wall motion abnormalities.  - she is very concerned about her worsening shortness of breath, which she reports is new. Will plan for Lexiscan Myoview (Lexiscan due to her recent dizziness) today. Contacted [facility] and they will be able to perform it later this afternoon. This was shared with the patient and she is ok with being NPO until the test. If no significant ischemia is noted, she would be stable for discharge from a Cardiology perspective and should follow-up with her Cardiologist at [facility].      2. History of Nonischemic Cardiomyopathy - followed by [facility]. EF 40-45% by echo in 03/2014. Repeat echocardiogram this admission shows an EF of 55-60% with no wall motion abnormalities. - continue BB and ARB.   3. Essential HTN - Patient had been hypotensive earlier this week following initiation of HCTZ (this was discontinued by her PCP prior to admission). BP has improved to 92/56 - 140/89 while admitted. - continue current medication regimen. Was mildly hypotensive overnight but was asymptomatic and in bed resting.   4. SVT  - s/p multiple ablations.  - no recurrence thus far this admission. EKG does show multiform PVC's.  Signed, Erma Heritage, PA-C 06/18/2015, 11:38 AM Pager: [phone]  Patient seen, examined. Available data reviewed. Agree with findings, assessment, and plan as outlined by Bernerd Pho, PA-C. The patient was independently interviewed and examined. She has a long-standing history of nonischemic cardiomyopathy. Recently she's had problems with labile blood pressure and multiple medication changes have been made as outlined. On exam, she is alert and oriented, in no distress. Lungs are clear  bilaterally. Heart is regular rate and rhythm without murmur or gallop. Abdomen is soft and nontender. Jugular venous pressure is normal. There is no pretibial edema. EKG shows sinus rhythm with  PVCs. An echocardiogram shows normal LV systolic function. Is is improved from her previous study one year ago that showed an ejection fraction of 40-45%. With episodic chest pain, it seems reasonable to perform a myocardial perfusion study for further risk stratification. This will be done this afternoon and if negative for ischemia, she could be discharged home from a cardiac perspective. The patient should follow-up with her primary cardiologist at [facility].  Sherren Mocha, M.D. 06/18/2015 12:46 PM

## 2015-06-18 NOTE — Discharge Summary (Signed)
Marie Fisher, is a 70 y.o. female  DOB 06-02-1945  MRN [id number].  Admission date:  06/17/2015  Admitting Physician  Albertine Patricia, MD  Discharge Date:  06/18/2015   Primary MD  SPARKS,JEFFREY D, MD  Recommendations for primary care physician for things to follow:   Outpatient Cards followup   Admission Diagnosis  Chest pain, unspecified chest pain type [R07.9]   Discharge Diagnosis  Chest pain, unspecified chest pain type [R07.9]    Active Problems:   GERD (gastroesophageal reflux disease)   Supraventricular tachycardia ([place])   Systolic dysfunction   Essential (primary) hypertension   Chest pain      Past Medical History  Diagnosis Date  . Allergy   . Anemia   . Depression   . Arthritis   . Cardiomyopathy ([place])   . Stroke ([place]) 2010    mini  . Hyperlipidemia 08/10/2014  . GERD (gastroesophageal reflux disease) 08/10/2014  . Asthma   . Fibrocystic breast 08/10/2014  . Hypertension   . 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 Urinary tract infection     recurrent  . Vaginal atrophy 08/10/2014  . 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 OAB (overactive bladder) 08/10/2014    Past Surgical History  Procedure Laterality Date  . Cervical spine surgery  1990  . Neck surgery      from front  . Carpal tunnel release      bil.  . Breast biopsy      x3  . Bladder surgery    . Ablation N/A     Cardiac       HPI  from the history and physical done on the day of admission:   Marie Fisher is a 70 y.o. 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, she presents to ED from PCP office for abnormal EKG, patient reports she had recent changes of her antihypertensive medication by PCP, with  few episodes of hypotension, she mentioned to her few episodes of chest pain today, EKG was obtained which was reported to be of normal, for which she was asked to come to ED, patient denies any history of coronary artery disease in the past, reports last nuclear stress test was 15 years ago when she was diagnosed with viral myocarditis, currently denies any chest pain, reports occasional feeling of chest discomfort, and shoulder discomfort, EKG with minimal flattening of T waves, troponins were negative, laboratory with no significant abnormalities, I was called to admit.      Hospital Course:      Chest pain atypical - Patient chest pain complaints Are atypical, she ruled out for MI, was seen by cardiology, underwent stress test which was unremarkable with low risk findings, per cardiology patient stable for discharge with outpatient follow-up with PCP and her primary care physician. No changes in home medications. Echo was stable as well.  Supraventricular tachycardia - Status post ablation X3, no recurrence per primary cardiologist note,  monitor on telemetry, continue with beta blockers.  HTN - Patient reports labile blood pressure recently, will continue with home medication and monitor.  History of systolic dysfunction, nonischemic cardiac myopathy EF 40-45% and 04/03/14 echo with dilated left ventricle - she was compensated this admission, no change in medications, ago done this admission shows an improved EF of 50-55%.  Depression - Continue with home medication     Discharge Condition: Stable  Follow UP  Follow-up Information    Follow up with SPARKS,JEFFREY D, MD. Call today.   Specialty:  Internal Medicine   Why:  and your Cardiologist   Contact information:   [place] [id number] [phone]        Consults obtained - Cards  Diet and Activity recommendation: See Discharge Instructions below  Discharge Instructions            Discharge Instructions    Diet - low sodium heart healthy    Complete by:  As directed      Diet - low sodium heart healthy    Complete by:  As directed      Discharge instructions    Complete by:  As directed   Follow with Primary MD SPARKS,JEFFREY D, MD and your Cardiologist in 7 days   Get CBC, CMP, 2 view Chest X ray checked  by Primary MD next visit.    Activity: As tolerated with Full fall precautions use walker/cane & assistance as needed   Disposition Home     Diet:   Heart Healthy  .  For Heart failure patients - Check your Weight same time everyday, if you gain over 2 pounds, or you develop in leg swelling, experience more shortness of breath or chest pain, call your Primary MD immediately. Follow Cardiac Low Salt Diet and 1.5 lit/day fluid restriction.   On your next visit with your primary care physician please Get Medicines reviewed and adjusted.   Please request your Prim.MD to go over all Hospital Tests and Procedure/Radiological results at the follow up, please get all Hospital records sent to your Prim MD by signing hospital release before you go home.   If you experience worsening of your admission symptoms, develop shortness of breath, life threatening emergency, suicidal or homicidal thoughts you must seek medical attention immediately by calling 911 or calling your MD immediately  if symptoms less severe.  You Must read complete instructions/literature along with all the possible adverse reactions/side effects for all the Medicines you take and that have been prescribed to you. Take any new Medicines after you have completely understood and accpet all the possible adverse reactions/side effects.   Do not drive, operating heavy machinery, perform activities at heights, swimming or participation in water activities or provide baby sitting services if your were admitted for syncope or siezures until you have seen by Primary MD or a Neurologist and advised to  do so again.  Do not drive when taking Pain medications.    Do not take more than prescribed Pain, Sleep and Anxiety Medications  Special Instructions: If you have smoked or chewed Tobacco  in the last 2 yrs please stop smoking, stop any regular Alcohol  and or any Recreational drug use.  Wear Seat belts while driving.   Please note  You were cared for by a hospitalist during your hospital stay. If you have any questions about your discharge medications or the care you received while you were in the hospital after you  are discharged, you can call the unit and asked to speak with the hospitalist on call if the hospitalist that took care of you is not available. Once you are discharged, your primary care physician will handle any further medical issues. Please note that NO REFILLS for any discharge medications will be authorized once you are discharged, as it is imperative that you return to your primary care physician (or establish a relationship with a primary care physician if you do not have one) for your aftercare needs so that they can reassess your need for medications and monitor your lab values.     Discharge instructions    Complete by:  As directed   Follow with Primary MD SPARKS,JEFFREY D, MD in 7 days   Get CBC, CMP, 2 view Chest X ray checked  by Primary MD next visit.    Activity: As tolerated with Full fall precautions use walker/cane & assistance as needed   Disposition Home     Diet:   Heart Healthy .  For Heart failure patients - Check your Weight same time everyday, if you gain over 2 pounds, or you develop in leg swelling, experience more shortness of breath or chest pain, call your Primary MD immediately. Follow Cardiac Low Salt Diet and 1.5 lit/day fluid restriction.   On your next visit with your primary care physician please Get Medicines reviewed and adjusted.   Please request your Prim.MD to go over all Hospital Tests and Procedure/Radiological results at  the follow up, please get all Hospital records sent to your Prim MD by signing hospital release before you go home.   If you experience worsening of your admission symptoms, develop shortness of breath, life threatening emergency, suicidal or homicidal thoughts you must seek medical attention immediately by calling 911 or calling your MD immediately  if symptoms less severe.  You Must read complete instructions/literature along with all the possible adverse reactions/side effects for all the Medicines you take and that have been prescribed to you. Take any new Medicines after you have completely understood and accpet all the possible adverse reactions/side effects.   Do not drive, operating heavy machinery, perform activities at heights, swimming or participation in water activities or provide baby sitting services if your were admitted for syncope or siezures until you have seen by Primary MD or a Neurologist and advised to do so again.  Do not drive when taking Pain medications.    Do not take more than prescribed Pain, Sleep and Anxiety Medications  Special Instructions: If you have smoked or chewed Tobacco  in the last 2 yrs please stop smoking, stop any regular Alcohol  and or any Recreational drug use.  Wear Seat belts while driving.   Please note  You were cared for by a hospitalist during your hospital stay. If you have any questions about your discharge medications or the care you received while you were in the hospital after you are discharged, you can call the unit and asked to speak with the hospitalist on call if the hospitalist that took care of you is not available. Once you are discharged, your primary care physician will handle any further medical issues. Please note that NO REFILLS for any discharge medications will be authorized once you are discharged, as it is imperative that you return to your primary care physician (or establish a relationship with a primary care physician  if you do not have one) for your aftercare needs so that they can reassess your need  for medications and monitor your lab values.     Increase activity slowly    Complete by:  As directed      Increase activity slowly    Complete by:  As directed              Discharge Medications       Medication List    TAKE these medications        aspirin 81 MG tablet  Take 81 mg by mouth daily.     BIO-D-MULSION 400 UNT/0.03ML Liqd  Generic drug:  cholecalciferol  Take 0.06 mL by mouth daily.     bisoprolol 5 MG tablet  Commonly known as:  ZEBETA  Take 5 mg by mouth daily.     buPROPion 300 MG 24 hr tablet  Commonly known as:  WELLBUTRIN XL  Take 300 mg by mouth daily.     candesartan 32 MG tablet  Commonly known as:  ATACAND  Take 32 mg by mouth daily.     CARBO GRABBER PO  Take 1 tablet by mouth daily.     Cranberry 500 MG Chew  Chew 500 mg by mouth daily.     [employer] PROBIOTIC MAXIMUM STRENGTH PO  Take 1 tablet by mouth daily.     DHA PO  Take 4 tablets by mouth daily.     DHEA 25 MG Caps  Take 5 mg by mouth daily.     docusate sodium 100 MG capsule  Commonly known as:  COLACE  Take 100 mg by mouth daily as needed for mild constipation.     estradiol 0.1 MG/GM vaginal cream  Commonly known as:  ESTRACE  Place 1 Applicatorful vaginally once a week.     EVENING PRIMROSE OIL PO  Take 1 tablet by mouth daily.     liothyronine 25 MCG tablet  Commonly known as:  CYTOMEL  Take 12.5 mcg by mouth 2 (two) times daily.     Methylcobalamin 5000 MCG Tbdp  Take 1 tablet by mouth daily.     mirabegron ER 25 MG Tb24 tablet  Commonly known as:  MYRBETRIQ  Take 50 mg by mouth daily.     NAC PO  Take 1 tablet by mouth daily.     nitrofurantoin 100 MG capsule  Commonly known as:  MACRODANTIN  Take 100 mg by mouth daily.     NON FORMULARY  4 tablets daily. Biotagen     PHOSPHATIDYL CHOLINE PO  Take 1 tablet by mouth daily.     Saw Palmetto 160 MG Caps  Take 320  mg by mouth daily.     SUMAtriptan 20 MG/ACT nasal spray  Commonly known as:  IMITREX  Place 20 mg into the nose every 2 (two) hours as needed for migraine. 1 Inhaler every two (2) hours as needed.     UNABLE TO FIND  30 mg daily. Med Name: [place] tab - DR V     UNABLE TO FIND  daily. Med Name: Catalyn [id number] Dr. Coralie Common TO FIND  4 tablets daily. Med Name: Lipotropix     UNABLE TO FIND  5,000 mcg 2 (two) times daily. Med Name: omega pure 820- DR V     UNABLE TO FIND  2 tablets daily. Med Name: Sharmaine Base     valACYclovir 500 MG tablet  Commonly known as:  VALTREX  Take 500 mg by mouth 2 (two) times daily.     vitamin C 500  MG tablet  Commonly known as:  ASCORBIC ACID  Take 500 mg by mouth daily.        Major procedures and Radiology Reports - PLEASE review detailed and final reports for all details, in brief -   TTE  Left ventricle: The cavity size was normal. Wall thickness was  normal. Systolic function was normal. The estimated ejection  fraction was in the range of 55% to 60%. Wall motion was normal;  there were no regional wall motion abnormalities. Doppler  parameters are consistent with abnormal left ventricular  relaxation (grade 1 diastolic dysfunction). - Aortic valve: Trileaflet; mildly thickened leaflets. There was  mild regurgitation.   Nm Myocar Multi W/spect W/wall Motion / Ef  06/18/2015   There were mild nonspecific ST changes at baseline that did not worsen with stress  The study is normal. No evidence of ischemia. No evidence of previous infarction  This is a low risk study.  Nuclear stress EF: 52%. Normal LV function .    Dg Chest Portable 1 View  06/17/2015  CLINICAL DATA:  Cardiac arrhythmia.  Hypertension. EXAM: PORTABLE CHEST 1 VIEW COMPARISON:  June 09, 2003 FINDINGS: No edema or consolidation. Heart is upper normal in size with pulmonary vascularity within normal limits. No adenopathy. There is postoperative change  in the lower cervical region. IMPRESSION: No edema or consolidation. Electronically Signed   By: Lowella Grip III M.D.   On: 06/17/2015 13:53    Micro Results      No results found for this or any previous visit (from the past 240 hour(s)).     Today   Subjective    Marie Fisher today has no headache,no chest abdominal pain,no new weakness tingling or numbness, feels much better wants to go home today.     Objective   Blood pressure 116/72, pulse 64, temperature 97.5 F (36.4 C), temperature source Oral, resp. rate 17, height 5' 2.5" (1.588 m), weight 62.143 kg (137 lb), SpO2 96 %.  No intake or output data in the 24 hours ending 06/18/15 1607  Exam Awake Alert, Oriented x 3, No new F.N deficits, Normal affect [place].AT,PERRAL Supple Neck,No JVD, No cervical lymphadenopathy appriciated.  Symmetrical Chest wall movement, Good air movement bilaterally, CTAB RRR,No Gallops,Rubs or new Murmurs, No Parasternal Heave +ve B.Sounds, Abd Soft, Non tender, No organomegaly appriciated, No rebound -guarding or rigidity. No Cyanosis, Clubbing or edema, No new Rash or bruise   Data Review   CBC w Diff:  Lab Results  Component Value Date   WBC 6.0 06/17/2015   HGB 13.9 06/17/2015   HCT 41.0 06/17/2015   PLT 236 06/17/2015    CMP:  Lab Results  Component Value Date   NA 140 06/17/2015   K 5.0 06/17/2015   CL 100* 06/17/2015   CO2 27 06/17/2015   BUN 29* 06/17/2015   CREATININE 0.90 06/17/2015   PROT 6.5 06/17/2015   ALBUMIN 4.0 06/17/2015   BILITOT 0.4 06/17/2015   ALKPHOS 41 06/17/2015   AST 36 06/17/2015   ALT 42 06/17/2015  .   Total Time in preparing paper work, data evaluation and todays exam - 35 minutes  Thurnell Lose M.D on 06/18/2015 at 4:07 PM  [facility]   Office  [phone]

## 2015-06-25 ENCOUNTER — Other Ambulatory Visit: Payer: Self-pay | Admitting: Internal Medicine

## 2015-06-25 DIAGNOSIS — R0789 Other chest pain: Secondary | ICD-10-CM

## 2015-06-25 DIAGNOSIS — R0609 Other forms of dyspnea: Secondary | ICD-10-CM

## 2015-06-25 DIAGNOSIS — R0600 Dyspnea, unspecified: Secondary | ICD-10-CM

## 2015-06-28 ENCOUNTER — Ambulatory Visit: Payer: Medicare Other

## 2015-06-28 DIAGNOSIS — I240 Acute coronary thrombosis not resulting in myocardial infarction: Secondary | ICD-10-CM | POA: Insufficient documentation

## 2015-06-28 DIAGNOSIS — Z8673 Personal history of transient ischemic attack (TIA), and cerebral infarction without residual deficits: Secondary | ICD-10-CM | POA: Insufficient documentation

## 2015-06-28 DIAGNOSIS — I259 Chronic ischemic heart disease, unspecified: Secondary | ICD-10-CM | POA: Insufficient documentation

## 2015-06-28 DIAGNOSIS — R0602 Shortness of breath: Secondary | ICD-10-CM | POA: Insufficient documentation

## 2015-07-05 ENCOUNTER — Ambulatory Visit
Admission: RE | Admit: 2015-07-05 | Discharge: 2015-07-05 | Disposition: A | Discharge status: Home / Self Care | Payer: Medicare Other | Source: Ambulatory Visit | Attending: Internal Medicine | Admitting: Internal Medicine

## 2015-07-05 DIAGNOSIS — R0789 Other chest pain: Secondary | ICD-10-CM | POA: Diagnosis present

## 2015-07-05 DIAGNOSIS — K229 Disease of esophagus, unspecified: Secondary | ICD-10-CM | POA: Diagnosis not present

## 2015-07-05 DIAGNOSIS — R0609 Other forms of dyspnea: Secondary | ICD-10-CM

## 2015-07-05 DIAGNOSIS — J9811 Atelectasis: Secondary | ICD-10-CM | POA: Insufficient documentation

## 2015-07-05 DIAGNOSIS — K449 Diaphragmatic hernia without obstruction or gangrene: Secondary | ICD-10-CM | POA: Diagnosis not present

## 2015-07-05 DIAGNOSIS — R0600 Dyspnea, unspecified: Secondary | ICD-10-CM

## 2015-07-05 HISTORY — DX: Heart failure, unspecified: I50.9

## 2015-07-05 MED ORDER — IOPAMIDOL (ISOVUE-370) INJECTION 76%
75.0000 mL | Freq: Once | INTRAVENOUS | Status: AC | PRN
  Administered 2015-07-05: 75 mL via INTRAVENOUS

## 2015-07-19 ENCOUNTER — Other Ambulatory Visit: Payer: Self-pay | Admitting: Gastroenterology

## 2015-07-19 DIAGNOSIS — R1319 Other dysphagia: Secondary | ICD-10-CM

## 2015-07-22 ENCOUNTER — Encounter: Payer: Self-pay | Admitting: Obstetrics and Gynecology

## 2015-07-22 ENCOUNTER — Ambulatory Visit (INDEPENDENT_AMBULATORY_CARE_PROVIDER_SITE_OTHER): Payer: Medicare Other | Admitting: Obstetrics and Gynecology

## 2015-07-22 VITALS — BP 124/82 | HR 81 | Ht 62.0 in | Wt 141.0 lb

## 2015-07-22 DIAGNOSIS — L298 Other pruritus: Secondary | ICD-10-CM | POA: Diagnosis not present

## 2015-07-22 DIAGNOSIS — N898 Other specified noninflammatory disorders of vagina: Secondary | ICD-10-CM

## 2015-07-22 DIAGNOSIS — Z1211 Encounter for screening for malignant neoplasm of colon: Secondary | ICD-10-CM

## 2015-07-22 DIAGNOSIS — N952 Postmenopausal atrophic vaginitis: Secondary | ICD-10-CM

## 2015-07-22 DIAGNOSIS — Z01419 Encounter for gynecological examination (general) (routine) without abnormal findings: Secondary | ICD-10-CM

## 2015-07-22 NOTE — Progress Notes (Signed)
Patient ID: Marie Fisher, female   DOB: [birth year], 70 y.o.   MRN: [id number] ANNUAL PREVENTATIVE CARE GYN  ENCOUNTER NOTE  Subjective:       Marie Fisher is a 70 y.o. No obstetric history on file. female here for a routine annual gynecologic exam.  Current complaints: 1.  Vaginal itching and burning. This is a chronic problem for this patient, says she's been dealing with this for years. Was told in the past that she has a parasitic infection, and takes mebendazole which improves the symptoms. Believes the infection comes from neighbor's cat. Feels like itching/burning are mostly external.  2. having botox in bladder by dr hart   Gynecologic History No LMP recorded (exact date). Patient is postmenopausal. Contraception: post menopausal status Last Pap: 2016. Results were: normal Last mammogram: 2016. Results were: normal  Obstetric History OB History  No data available    Past Medical History  Diagnosis Date  . Allergy   . Anemia   . Depression   . Arthritis   . Cardiomyopathy ([place])   . Stroke ([place]) 2010    mini  . Hyperlipidemia 08/10/2014  . GERD (gastroesophageal reflux disease) 08/10/2014  . Asthma   . Fibrocystic breast 08/10/2014  . Hypertension   . 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 Urinary tract infection     recurrent  . Vaginal atrophy 08/10/2014  . 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 OAB (overactive bladder) 08/10/2014  . CHF (congestive heart failure) [facility])     Past Surgical History  Procedure Laterality Date  . Cervical spine surgery  1990  . Neck surgery      from front  . Carpal tunnel release      bil.  . Breast biopsy      x3  . Bladder surgery    . Ablation N/A     Cardiac    Current Outpatient Prescriptions on File Prior to Visit  Medication Sig Dispense Refill  . aspirin 81 MG tablet Take 81 mg by mouth daily.    . bisoprolol (ZEBETA) 5 MG tablet Take 5 mg by mouth daily.     Marland Kitchen buPROPion  (WELLBUTRIN XL) 300 MG 24 hr tablet Take 300 mg by mouth daily.    . candesartan (ATACAND) 32 MG tablet Take 32 mg by mouth daily.    . cholecalciferol (BIO-D-MULSION) 400 UNT/0.03ML LIQD Take 0.06 mL by mouth daily.    . Cranberry 500 MG CHEW Chew 500 mg by mouth daily.     Marland Kitchen DHEA 25 MG CAPS Take 5 mg by mouth daily.     . Docosahexaenoic Acid (DHA PO) Take 4 tablets by mouth daily.    Marland Kitchen docusate sodium (COLACE) 100 MG capsule Take 100 mg by mouth daily as needed for mild constipation.     Marland Kitchen estradiol (ESTRACE) 0.1 MG/GM vaginal cream Place 1 Applicatorful vaginally once a week.     Marland Kitchen EVENING PRIMROSE OIL PO Take 1 tablet by mouth daily.     Marland Kitchen liothyronine (CYTOMEL) 25 MCG tablet Take 12.5 mcg by mouth 2 (two) times daily.     . Methylcobalamin 5000 MCG TBDP Take 1 tablet by mouth daily.     . mirabegron ER (MYRBETRIQ) 25 MG TB24 tablet Take 50 mg by mouth daily.     . nitrofurantoin (MACRODANTIN) 100 MG capsule Take 100 mg by mouth daily.     . NON FORMULARY 4 tablets daily. Biotagen    .  PHOSPHATIDYL CHOLINE PO Take 1 tablet by mouth daily.     . Probiotic Product ([employer]S PROBIOTIC MAXIMUM STRENGTH PO) Take 1 tablet by mouth daily.     . Saw Palmetto 160 MG CAPS Take 320 mg by mouth daily.     . SUMAtriptan (IMITREX) 20 MG/ACT nasal spray Place 20 mg into the nose every 2 (two) hours as needed for migraine. 1 Inhaler every two (2) hours as needed.    Marland Kitchen UNABLE TO FIND 30 mg daily. Med Name: [place] [facility] 4 tablets daily. Med Name: Lipotropix    . UNABLE TO FIND 5,000 mcg 2 (two) times daily. Med Name: omega pure 820- DR V    . UNABLE TO FIND 2 tablets daily. Med Name: Sharmaine Base    . valACYclovir (VALTREX) 500 MG tablet Take 500 mg by mouth 2 (two) times daily.    . vitamin C (ASCORBIC ACID) 500 MG tablet Take 500 mg by mouth daily.     No current facility-administered medications on file prior to visit.    Allergies  Allergen Reactions  . Other Itching     Dust     . Wheat Bran Other (See Comments)    Extreme fatigue    Social History   Social History  . Marital Status: Divorced    Spouse Name: N/A  . Number of Children: N/A  . Years of Education: N/A   Occupational History  . Not on file.   Social History Main Topics  . Smoking status: Never Smoker   . Smokeless tobacco: Never Used  . Alcohol Use: Yes     Comment: monthly  . Drug Use: No  . Sexual Activity: Not Currently    Birth Control/ Protection: Post-menopausal   Other Topics Concern  . Not on file   Social History Narrative    Family History  Problem Relation Age of Onset  . Colon cancer Neg Hx   . Stomach cancer Neg Hx   . Ovarian cancer Neg Hx   . Diabetes Mellitus I Father   . Heart disease Father   . Heart disease Mother   . Diabetes Mellitus I Paternal Aunt   . Breast cancer Paternal Grandmother   . Breast cancer Maternal Grandmother   . Diabetes Mellitus I Maternal Grandfather     The following portions of the patient's history were reviewed and updated as appropriate: allergies, current medications, past family history, past medical history, past social history, past surgical history and problem list.  Review of Systems ROS Review of Systems - General ROS: negative for - chills, fatigue, fever, hot flashes, night sweats, weight gain or weight loss Psychological ROS: negative for - anxiety, decreased libido, depression, mood swings, physical abuse or sexual abuse Ophthalmic ROS: negative for - blurry vision, eye pain or loss of vision ENT ROS: negative for - headaches, hearing change, visual changes or vocal changes Allergy and Immunology ROS: negative for - hives, itchy/watery eyes or seasonal allergies Hematological and Lymphatic ROS: negative for - bleeding problems, bruising, swollen lymph nodes or weight loss Endocrine ROS: negative for - galactorrhea, hair pattern changes, hot flashes, malaise/lethargy, mood swings, palpitations,  polydipsia/polyuria, skin changes, temperature intolerance or unexpected weight changes Breast ROS: negative for - new or changing breast lumps or nipple discharge Respiratory ROS: negative for - shortness of breath; +cough Cardiovascular ROS: negative for - chest pain, irregular heartbeat, palpitations or shortness of breath Gastrointestinal ROS: no abdominal  pain, change in bowel habits, or black or bloody stools Genito-Urinary ROS: no dysuria, or hematuria; +OAB Musculoskeletal ROS: positive for chronic neck/back pain - recently given dx of scoliosis from neurologist Neurological ROS: negative for - bowel and bladder control changes Dermatological ROS: negative for rash and skin lesion changes   Objective:   BP 124/82 mmHg  Pulse 81  Ht 5\' 2"  (1.575 m)  Wt 141 lb (63.957 kg)  BMI 25.78 kg/m2  LMP  (Exact Date) CONSTITUTIONAL: Well-developed, well-nourished female in no acute distress.  PSYCHIATRIC: Normal mood and affect. Normal behavior. Normal judgment and thought content. [place]: Alert and oriented to person, place, and time. Normal muscle tone coordination. No cranial nerve deficit noted. HENT:  Normocephalic, atraumatic, External right and left ear normal. Oropharynx is clear and moist EYES: Conjunctivae and EOM are normal. Pupils are equal, round, and reactive to light. No scleral icterus.  NECK: Normal range of motion, supple, no masses.  Normal thyroid.  SKIN: Skin is warm and dry. No rash noted. Not diaphoretic. No erythema. No pallor. CARDIOVASCULAR: Normal heart rate noted, regular rhythm, no murmur. RESPIRATORY: Clear to auscultation bilaterally. Effort and breath sounds normal, no problems with respiration noted. BREASTS: Symmetric in size. No masses, skin changes, nipple drainage, or lymphadenopathy. Scars noted from prior biopsies. ABDOMEN: Soft, normal bowel sounds, no distention noted.  No tenderness, rebound or guarding.  BLADDER: Normal PELVIC:  External  Genitalia: Normal; No evidence of rash, hyperemia, or epithelial skin breakdown  BUS: Normal  Vagina: Mild atrophy  Cervix: Normal; no cervical motion tenderness; no lesions  Uterus: Normal; midplane, normal size and shape, nontender  Adnexa: Normal  RV: External Exam NormaI, No Rectal Masses and Normal Sphincter tone  MUSCULOSKELETAL: Normal range of motion. No tenderness.  No cyanosis, clubbing, or edema.  2+ distal pulses. LYMPHATIC: No Axillary, Supraclavicular, or Inguinal Adenopathy.  PROCEDURE: Wet prep Normal saline-normal epithelial cells; no clue cells; no white blood cells; no Trichomonas KOH-no yeast    Assessment:   Annual gynecologic examination 70 y.o. Contraception: post menopausal status bmi-25 Problem List Items Addressed This Visit      Genitourinary   Vaginal atrophy    Other Visit Diagnoses    Screening for colon cancer    -  Primary    Relevant Orders    Fecal occult blood, imunochemical    Vaginal itching        Relevant Orders    NuSwab BV and Candida, NAA    Well woman exam with routine gynecological exam        Relevant Orders    Pap IG w/ reflex to HPV when ASC-U       Plan:  Pap: done today per patient wishes Mammogram: thru pcp Stool Guaiac Testing:  ordered Labs: thru pcp Routine preventative health maintenance measures emphasized: Exercise/Diet/Weight control, Tobacco Warnings and Alcohol/Substance use risks Wet Prep done for vaginal itching/burning - no yeast or abnormal cells observed; NuSwab sent, will call patient with results. Return to [place] PA-S Brayton Mars, MD   I have seen, interviewed, and examined the patient in conjunction with the [facility].A. student and affirm the diagnosis and management plan. Hakeem Frazzini A. Yahmir Sokolov, MD, FACOG   Note: This dictation was prepared with Dragon dictation along with smaller phrase technology. Any transcriptional errors that result from this  process are unintentional.

## 2015-07-22 NOTE — Patient Instructions (Signed)
1. Pap smear is completed. 2. Mammograms ordered 3. Stool guaiac cards are given for colon cancer screening 4. Continue with healthy eating and exercise. 5. Continue with calcium and vitamin D supplementation daily 6. Wet prep slides was negative for vaginitis.Nu swab is sent. 7. Return in 1 year for annual exam 3. Refill estrogen replacement therapy prescription often minute lubricant sutures in a is a long time if you would like to

## 2015-07-26 LAB — NUSWAB BV AND CANDIDA, NAA
Candida albicans, NAA: NEGATIVE
Candida glabrata, NAA: NEGATIVE

## 2015-07-26 LAB — PAP IG W/ RFLX HPV ASCU: PAP Smear Comment: 0

## 2015-07-28 ENCOUNTER — Ambulatory Visit: Payer: Medicare Other

## 2015-08-03 ENCOUNTER — Ambulatory Visit
Admission: RE | Admit: 2015-08-03 | Discharge: 2015-08-03 | Disposition: A | Discharge status: Home / Self Care | Payer: Medicare Other | Source: Ambulatory Visit | Attending: Gastroenterology | Admitting: Gastroenterology

## 2015-08-03 DIAGNOSIS — R938 Abnormal findings on diagnostic imaging of other specified body structures: Secondary | ICD-10-CM | POA: Diagnosis not present

## 2015-08-03 DIAGNOSIS — R1319 Other dysphagia: Secondary | ICD-10-CM

## 2015-08-03 DIAGNOSIS — Z981 Arthrodesis status: Secondary | ICD-10-CM | POA: Insufficient documentation

## 2015-08-03 DIAGNOSIS — R1310 Dysphagia, unspecified: Secondary | ICD-10-CM | POA: Insufficient documentation

## 2015-09-23 ENCOUNTER — Encounter: Payer: Self-pay | Admitting: *Deleted

## 2015-09-24 ENCOUNTER — Ambulatory Visit
Admission: RE | Admit: 2015-09-24 | Discharge: 2015-09-24 | Disposition: A | Discharge status: Home / Self Care | Payer: Medicare Other | Source: Ambulatory Visit | Attending: Gastroenterology | Admitting: Gastroenterology

## 2015-09-24 ENCOUNTER — Ambulatory Visit: Payer: Medicare Other | Admitting: Anesthesiology

## 2015-09-24 ENCOUNTER — Encounter
Admission: RE | Disposition: A | Discharge status: Home / Self Care | Payer: Self-pay | Source: Ambulatory Visit | Attending: Gastroenterology

## 2015-09-24 ENCOUNTER — Encounter: Payer: Self-pay | Admitting: *Deleted

## 2015-09-24 DIAGNOSIS — R1310 Dysphagia, unspecified: Secondary | ICD-10-CM | POA: Diagnosis present

## 2015-09-24 DIAGNOSIS — K2210 Ulcer of esophagus without bleeding: Secondary | ICD-10-CM | POA: Insufficient documentation

## 2015-09-24 DIAGNOSIS — D130 Benign neoplasm of esophagus: Secondary | ICD-10-CM | POA: Insufficient documentation

## 2015-09-24 DIAGNOSIS — Z7982 Long term (current) use of aspirin: Secondary | ICD-10-CM | POA: Insufficient documentation

## 2015-09-24 DIAGNOSIS — K210 Gastro-esophageal reflux disease with esophagitis: Secondary | ICD-10-CM | POA: Insufficient documentation

## 2015-09-24 DIAGNOSIS — Z8619 Personal history of other infectious and parasitic diseases: Secondary | ICD-10-CM | POA: Insufficient documentation

## 2015-09-24 DIAGNOSIS — I509 Heart failure, unspecified: Secondary | ICD-10-CM | POA: Diagnosis not present

## 2015-09-24 DIAGNOSIS — Z8673 Personal history of transient ischemic attack (TIA), and cerebral infarction without residual deficits: Secondary | ICD-10-CM | POA: Diagnosis not present

## 2015-09-24 DIAGNOSIS — N3281 Overactive bladder: Secondary | ICD-10-CM | POA: Diagnosis not present

## 2015-09-24 DIAGNOSIS — E785 Hyperlipidemia, unspecified: Secondary | ICD-10-CM | POA: Diagnosis not present

## 2015-09-24 DIAGNOSIS — K2960 Other gastritis without bleeding: Secondary | ICD-10-CM | POA: Insufficient documentation

## 2015-09-24 DIAGNOSIS — M1990 Unspecified osteoarthritis, unspecified site: Secondary | ICD-10-CM | POA: Insufficient documentation

## 2015-09-24 DIAGNOSIS — Z9109 Other allergy status, other than to drugs and biological substances: Secondary | ICD-10-CM | POA: Diagnosis not present

## 2015-09-24 DIAGNOSIS — F329 Major depressive disorder, single episode, unspecified: Secondary | ICD-10-CM | POA: Insufficient documentation

## 2015-09-24 DIAGNOSIS — Z79899 Other long term (current) drug therapy: Secondary | ICD-10-CM | POA: Diagnosis not present

## 2015-09-24 DIAGNOSIS — E039 Hypothyroidism, unspecified: Secondary | ICD-10-CM | POA: Insufficient documentation

## 2015-09-24 DIAGNOSIS — J45909 Unspecified asthma, uncomplicated: Secondary | ICD-10-CM | POA: Diagnosis not present

## 2015-09-24 DIAGNOSIS — I429 Cardiomyopathy, unspecified: Secondary | ICD-10-CM | POA: Diagnosis not present

## 2015-09-24 DIAGNOSIS — I471 Supraventricular tachycardia: Secondary | ICD-10-CM | POA: Insufficient documentation

## 2015-09-24 DIAGNOSIS — I110 Hypertensive heart disease with heart failure: Secondary | ICD-10-CM | POA: Insufficient documentation

## 2015-09-24 DIAGNOSIS — Z91018 Allergy to other foods: Secondary | ICD-10-CM | POA: Diagnosis not present

## 2015-09-24 HISTORY — DX: Viral cardiomyopathy: B33.24

## 2015-09-24 HISTORY — DX: Supraventricular tachycardia: I47.1

## 2015-09-24 HISTORY — DX: Supraventricular tachycardia, unspecified: I47.10

## 2015-09-24 HISTORY — DX: Headache, unspecified: R51.9

## 2015-09-24 HISTORY — DX: Headache: R51

## 2015-09-24 HISTORY — PX: ESOPHAGOGASTRODUODENOSCOPY (EGD) WITH PROPOFOL: SHX5813

## 2015-09-24 SURGERY — ESOPHAGOGASTRODUODENOSCOPY (EGD) WITH PROPOFOL
Anesthesia: General

## 2015-09-24 MED ORDER — PROPOFOL 500 MG/50ML IV EMUL
INTRAVENOUS | Status: DC | PRN
  Administered 2015-09-24: 140 ug/kg/min via INTRAVENOUS

## 2015-09-24 MED ORDER — MIDAZOLAM HCL 5 MG/5ML IJ SOLN
INTRAMUSCULAR | Status: DC | PRN
  Administered 2015-09-24: 1 mg via INTRAVENOUS

## 2015-09-24 MED ORDER — LIDOCAINE 2% (20 MG/ML) 5 ML SYRINGE
INTRAMUSCULAR | Status: DC | PRN
  Administered 2015-09-24: 100 mg via INTRAVENOUS

## 2015-09-24 MED ORDER — FENTANYL CITRATE (PF) 100 MCG/2ML IJ SOLN
INTRAMUSCULAR | Status: DC | PRN
  Administered 2015-09-24: 50 ug via INTRAVENOUS

## 2015-09-24 MED ORDER — SODIUM CHLORIDE 0.9 % IV SOLN
INTRAVENOUS | Status: DC
Start: 2015-09-24 — End: 2015-09-24
  Administered 2015-09-24 (×2): via INTRAVENOUS

## 2015-09-24 MED ORDER — SODIUM CHLORIDE 0.9 % IV SOLN: INTRAVENOUS | Status: DC

## 2015-09-24 MED ORDER — PROPOFOL 10 MG/ML IV BOLUS
INTRAVENOUS | Status: DC | PRN
  Administered 2015-09-24: 20 mg via INTRAVENOUS
  Administered 2015-09-24: 30 mg via INTRAVENOUS

## 2015-09-24 NOTE — Op Note (Signed)
[facility] Gastroenterology Patient Name: Marie Fisher Procedure Date: 09/24/2015 8:25 AM MRN: [id number] Account #: [id number] Date of Birth: December 12, 1945 Admit Type: Outpatient Age: 70 Room: [facility] ENDO ROOM 1 Gender: Female Note Status: Finalized Procedure:            Upper GI endoscopy Indications:          Dysphagia Providers:            Lollie Sails, MD Referring MD:         Leonie Douglas. Doy Hutching, MD (Referring MD) Medicines:            Monitored Anesthesia Care Complications:        No immediate complications. Procedure:            Pre-Anesthesia Assessment:                       - ASA Grade Assessment: II - A patient with mild                        systemic disease.                       After obtaining informed consent, the endoscope was                        passed under direct vision. Throughout the procedure,                        the patient's blood pressure, pulse, and oxygen                        saturations were monitored continuously. The Endoscope                        was introduced through the mouth, and advanced to the                        third part of duodenum. The upper GI endoscopy was                        accomplished without difficulty. The patient tolerated                        the procedure well. Findings:      LA Grade C (one or more mucosal breaks continuous between tops of 2 or       more mucosal folds, less than 75% circumference) esophagitis with no       bleeding was found. Biopsies were taken with a cold forceps for       histology.      papillary lesion at 26 cm from the alveolar ridge, removed with a cold       forcep      Patchy mild inflammation characterized by adherent blood, erosions and       erythema was found in the gastric antrum. Biopsies were taken with a       cold forceps for histology. Biopsies were taken with a cold forceps for       Helicobacter pylori testing.      The cardia and gastric  fundus were normal on retroflexion.      The examined duodenum was normal.  of note, the defect noted on UGIS is noted in the cervical esophagus,       submucosal "bump" Impression:           - LA Grade C erosive esophagitis. Biopsied.                       - Erosive gastritis. Biopsied.                       - Normal examined duodenum. Recommendation:       - Discharge patient to home.                       - Use Protonix (pantoprazole) 40 mg PO daily daily. Procedure Code(s):    --- Professional ---                       [phone], Esophagogastroduodenoscopy, flexible, transoral;                        with biopsy, single or multiple Diagnosis Code(s):    --- Professional ---                       K20.8, Other esophagitis                       K29.60, Other gastritis without bleeding                       R13.10, Dysphagia, unspecified CPT copyright 2016 American Medical Association. All rights reserved. The codes documented in this report are preliminary and upon coder review may  be revised to meet current compliance requirements. Lollie Sails, MD 09/24/2015 9:09:38 AM This report has been signed electronically. Number of Addenda: 0 Note Initiated On: 09/24/2015 8:25 AM      [facility]

## 2015-09-24 NOTE — Anesthesia Preprocedure Evaluation (Signed)
Anesthesia Evaluation  Patient identified by MRN, date of birth, ID band Patient awake    Reviewed: Allergy & Precautions, NPO status , Patient's Chart, lab work & pertinent test results  History of Anesthesia Complications Negative for: history of anesthetic complications  Airway Mallampati: II       Dental   Pulmonary neg pulmonary ROS, asthma (no symptoms x1 yr) ,           Cardiovascular hypertension, Pt. on medications and Pt. on home beta blockers +CHF  + Valvular Problems/Murmurs MVP      Neuro/Psych Depression TIAnegative neurological ROS     GI/Hepatic Neg liver ROS, GERD  ,  Endo/Other  Hypothyroidism   Renal/GU negative Renal ROS     Musculoskeletal  (+) Arthritis , Osteoarthritis,    Abdominal   Peds  Hematology  (+) anemia ,   Anesthesia Other Findings   Reproductive/Obstetrics                             Anesthesia Physical Anesthesia Plan  ASA: III  Anesthesia Plan: General   Post-op Pain Management:    Induction: Intravenous  Airway Management Planned: Nasal Cannula  Additional Equipment:   Intra-op Plan:   Post-operative Plan:   Informed Consent: I have reviewed the patients History and Physical, chart, labs and discussed the procedure including the risks, benefits and alternatives for the proposed anesthesia with the patient or authorized representative who has indicated his/her understanding and acceptance.     Plan Discussed with:   Anesthesia Plan Comments:         Anesthesia Quick Evaluation

## 2015-09-24 NOTE — Anesthesia Postprocedure Evaluation (Signed)
Anesthesia Post Note  Patient: Marie Fisher  Procedure(s) Performed: Procedure(s) (LRB): ESOPHAGOGASTRODUODENOSCOPY (EGD) WITH PROPOFOL (N/A)  Patient location during evaluation: Endoscopy Anesthesia Type: General Level of consciousness: awake and alert Pain management: pain level controlled Vital Signs Assessment: post-procedure vital signs reviewed and stable Respiratory status: spontaneous breathing and respiratory function stable Cardiovascular status: stable Anesthetic complications: no    Last Vitals:  Filed Vitals:   09/24/15 0807 09/24/15 0909  BP: 129/71 105/66  Pulse: 62 65  Temp: 36.6 C 36.3 C  Resp: 18 13    Last Pain: There were no vitals filed for this visit.               KEPHART,WILLIAM K

## 2015-09-24 NOTE — Transfer of Care (Signed)
Immediate Anesthesia Transfer of Care Note  Patient: RAGENE INGRIM  Procedure(s) Performed: Procedure(s): ESOPHAGOGASTRODUODENOSCOPY (EGD) WITH PROPOFOL (N/A)  Patient Location: PACU  Anesthesia Type:General  Level of Consciousness: sedated  Airway & Oxygen Therapy: Patient Spontanous Breathing and Patient connected to nasal cannula oxygen  Post-op Assessment: Report given to RN and Post -op Vital signs reviewed and stable  Post vital signs: Reviewed and stable  Last Vitals:  Filed Vitals:   09/24/15 0807 09/24/15 0909  BP: 129/71 105/66  Pulse: 62 65  Temp: 36.6 C 36.3 C  Resp: 18 13    Last Pain: There were no vitals filed for this visit.       Complications: No apparent anesthesia complications

## 2015-09-24 NOTE — H&P (Signed)
Outpatient short stay form Pre-procedure 09/24/2015 8:40 AM Marie Sails MD  Primary Physician: Dr. Fulton Reek  Reason for visit:  EGD  History of present illness:  Patient is a 70 year old female presenting today as above. She has a complaint of some dysphagia. She had a barium swallow done on 08/03/2015 this showing a deviation of the cervical esophagus at the level of fusion hardware in the neck. A barium tablet passed without difficulty although was deviated at the area of the  cervical hardware. Patient is not regurgitating foods. She has been placed on a proton pump inhibitor.   Current facility-administered medications:  .  0.9 %  sodium chloride infusion, , Intravenous, Continuous, Marie Sails, MD, Last Rate: 20 mL/hr at 09/24/15 [id number] .  0.9 %  sodium chloride infusion, , Intravenous, Continuous, Marie Sails, MD  Prescriptions prior to admission  Medication Sig Dispense Refill Last Dose  . ALBENDAZOLE PO Take 40 mg by mouth daily.     Marland Kitchen amLODipine (NORVASC) 2.5 MG tablet Take by mouth.   09/23/2015 at Unknown time  . aspirin 81 MG tablet Take 81 mg by mouth daily.   Past Week at Unknown time  . bisoprolol (ZEBETA) 5 MG tablet Take 5 mg by mouth daily. Reported on 09/23/2015   09/23/2015 at Unknown time  . buPROPion (WELLBUTRIN XL) 300 MG 24 hr tablet Take 300 mg by mouth daily.   09/23/2015 at Unknown time  . Calcium Citrate 200 MG TABS Take 300 mg by mouth.   Past Week at Unknown time  . candesartan (ATACAND) 32 MG tablet Take 32 mg by mouth daily.   09/23/2015 at Unknown time  . cholecalciferol (BIO-D-MULSION) 400 UNT/0.03ML LIQD Take 0.06 mL by mouth daily.   Past Week at Unknown time  . Cranberry 500 MG CHEW Chew 500 mg by mouth daily.    Past Week at Unknown time  . DHEA 25 MG CAPS Take 5 mg by mouth daily.    Past Week at Unknown time  . Docosahexaenoic Acid (DHA PO) Take 4 tablets by mouth daily.   Past Week at Unknown time  . docusate sodium (COLACE) 100 MG  capsule Take 100 mg by mouth daily as needed for mild constipation.    09/23/2015 at Unknown time  . EVENING PRIMROSE OIL PO Take 1 tablet by mouth daily.    Past Week at Unknown time  . liothyronine (CYTOMEL) 25 MCG tablet Take 12.5 mcg by mouth 2 (two) times daily.    09/23/2015 at Unknown time  . Magnesium Citrate 100 MG TABS Take by mouth.   Past Week at Unknown time  . Methylcobalamin 5000 MCG TBDP Take 1 tablet by mouth daily.    Past Week at Unknown time  . mirabegron ER (MYRBETRIQ) 25 MG TB24 tablet Take 50 mg by mouth daily.    09/23/2015 at Unknown time  . NON FORMULARY 4 tablets daily. Biotagen   Past Week at Unknown time  . pantoprazole (PROTONIX) 40 MG tablet Take by mouth.   Past Week at Unknown time  . PHOSPHATIDYL CHOLINE PO Take 1 tablet by mouth daily.    Past Week at Unknown time  . Probiotic Product ([employer]S PROBIOTIC MAXIMUM STRENGTH PO) Take 1 tablet by mouth daily.    Past Week at Unknown time  . Saw Palmetto 160 MG CAPS Take 320 mg by mouth daily.    Past Week at Unknown time  . UNABLE TO FIND 30 mg daily. Med Name:  [place] tab - DR V   Past Week at Unknown time  . UNABLE TO FIND 4 tablets daily. Med Name: Lipotropix   Past Week at Unknown time  . UNABLE TO FIND 5,000 mcg 2 (two) times daily. Med Name: omega pure 56- DR V   Past Week at Unknown time  . UNABLE TO FIND 2 tablets daily. Med Name: Sharmaine Base   Past Week at Unknown time  . valACYclovir (VALTREX) 500 MG tablet Take 500 mg by mouth 2 (two) times daily.   09/23/2015 at Unknown time  . vitamin C (ASCORBIC ACID) 500 MG tablet Take 500 mg by mouth daily.   Past Week at Unknown time  . estradiol (ESTRACE) 0.1 MG/GM vaginal cream Place 1 Applicatorful vaginally once a week.    Taking  . nitrofurantoin (MACRODANTIN) 100 MG capsule Take 100 mg by mouth daily. Reported on 09/24/2015   Not Taking at Unknown time  . SUMAtriptan (IMITREX) 20 MG/ACT nasal spray Place 20 mg into the nose every 2 (two) hours as needed for migraine. 1  Inhaler every two (2) hours as needed.   Taking     Allergies  Allergen Reactions  . Other Itching    Dust     . Wheat Bran Other (See Comments)    Extreme fatigue     Past Medical History  Diagnosis Date  . Allergy   . Anemia   . Depression   . Arthritis   . Cardiomyopathy ([place])   . Stroke ([place]) 2010    mini  . Hyperlipidemia 08/10/2014  . GERD (gastroesophageal reflux disease) 08/10/2014  . Asthma   . Fibrocystic breast 08/10/2014  . Hypertension   . 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 Urinary tract infection     recurrent  . Vaginal atrophy 08/10/2014  . 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 OAB (overactive bladder) 08/10/2014  . CHF (congestive heart failure) ([place])   . Headache   . SVT (supraventricular tachycardia) ([place])   . 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    HEENT: Normocephalic atraumatic eyes are anicteric    Other:     Pertinant exam for procedure: Soft nontender nondistended bowel sounds positive normoactive.    Planned proceedures: EGD and indicated procedures. I have discussed the risks benefits and complications of procedures to include not limited to bleeding, infection, perforation and the risk of sedation and the patient wishes to proceed.    Marie Sails, MD Gastroenterology 09/24/2015  8:40 AM

## 2015-09-27 ENCOUNTER — Encounter: Payer: Self-pay | Admitting: Gastroenterology

## 2015-09-27 LAB — SURGICAL PATHOLOGY

## 2016-01-10 LAB — FECAL OCCULT BLOOD, IMMUNOCHEMICAL: Fecal Occult Bld: NEGATIVE

## 2016-04-27 ENCOUNTER — Telehealth: Payer: Self-pay | Admitting: Obstetrics and Gynecology

## 2016-04-27 MED ORDER — ESTRADIOL 0.1 MG/GM VA CREA: 0.2500 | TOPICAL_CREAM | VAGINAL | 1 refills | Status: DC

## 2016-04-27 NOTE — Telephone Encounter (Signed)
Pt aware med erx. 

## 2016-04-27 NOTE — Telephone Encounter (Signed)
Pt said her and Dr Tennis Must talked about a vaginal cream (estrace) she wants to sent to [facility] on [address] [phone]

## 2016-04-28 ENCOUNTER — Telehealth: Payer: Self-pay | Admitting: Obstetrics and Gynecology

## 2016-04-28 MED ORDER — ESTRADIOL 0.1 MG/GM VA CREA: 0.5000 | TOPICAL_CREAM | VAGINAL | 1 refills | Status: DC

## 2016-04-28 NOTE — Telephone Encounter (Signed)
Pt states incorrect directions on rx. Sent in new rx with correct directions.

## 2016-04-28 NOTE — Telephone Encounter (Signed)
PT CALLED AND SHE WANTED TOP SPEAK WITH YOU ABOUT A REFILL ON HER RX

## 2016-07-27 ENCOUNTER — Encounter: Payer: Medicare Other | Admitting: Obstetrics and Gynecology

## 2016-08-11 NOTE — Progress Notes (Signed)
Patient ID: Marie Fisher, female   DOB: 07/17/1945, 71 y.o.   MRN: [id number] ANNUAL PREVENTATIVE CARE GYN  ENCOUNTER NOTE  Subjective:       Marie Fisher is a 71 y.o. G3 P3003. female here for a routine annual gynecologic exam.  Current complaints: 1. S/p botox inj iin bladder- using cath 5x day- received too much botox; Patient feels that this is a slight improvement over what she had been experiencing with urge incontinence. The Botox side effects have lasted greater than 1 year.  Patient does have a moderate cystocele which is asymptomatic. She does have regular bowel movements on a daily basis.  She is going in for heart evaluation history due to symptoms of chronic fatigue and worsening shortness of breath and dyspnea on exertion    Gynecologic History No LMP recorded. Patient is postmenopausal. Contraception: post menopausal status Last Pap: 06/2015 neg. Results were: normal Last mammogram: 05/2014 normal Results were: normal  Obstetric History OB History  No data available    Past Medical History:  Diagnosis Date  . Allergy   . Anemia   . Arthritis   . Asthma   . Cardiomyopathy ([place])   . CHF (congestive heart failure) ([place])   . 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 Depression   . Fibrocystic breast 08/10/2014  . GERD (gastroesophageal reflux disease) 08/10/2014  . Headache   . Hyperlipidemia 08/10/2014  . Hypertension   . OAB (overactive bladder) 08/10/2014  . 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 Stroke ([place]) 2010   mini  . SVT (supraventricular tachycardia) ([place])   . Urinary tract infection    recurrent  . Vaginal atrophy 08/10/2014  . 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 ABLATION N/A    Cardiac  . BLADDER SURGERY    . BREAST BIOPSY     x3  . CARPAL TUNNEL RELEASE     bil.  . CERVICAL SPINE SURGERY  1990  . ESOPHAGOGASTRODUODENOSCOPY (EGD) WITH PROPOFOL N/A 09/24/2015    Procedure: ESOPHAGOGASTRODUODENOSCOPY (EGD) WITH PROPOFOL;  Surgeon: Lollie Sails, MD;  Location: [facility] ENDOSCOPY;  Service: Endoscopy;  Laterality: N/A;  . NECK SURGERY     from front  . tension free vaginal tape      Current Outpatient Prescriptions on File Prior to Visit  Medication Sig Dispense Refill  . ALBENDAZOLE PO Take 40 mg by mouth daily.    Marland Kitchen amLODipine (NORVASC) 2.5 MG tablet Take by mouth.    Marland Kitchen aspirin 81 MG tablet Take 81 mg by mouth daily.    . bisoprolol (ZEBETA) 5 MG tablet Take 5 mg by mouth daily. Reported on 09/23/2015    . buPROPion (WELLBUTRIN XL) 300 MG 24 hr tablet Take 300 mg by mouth daily.    . Calcium Citrate 200 MG TABS Take 300 mg by mouth.    . candesartan (ATACAND) 32 MG tablet Take 32 mg by mouth daily.    . cholecalciferol (BIO-D-MULSION) 400 UNT/0.03ML LIQD Take 0.06 mL by mouth daily.    . Cranberry 500 MG CHEW Chew 500 mg by mouth daily.     Marland Kitchen DHEA 25 MG CAPS Take 5 mg by mouth daily.     . Docosahexaenoic Acid (DHA PO) Take 4 tablets by mouth daily.    Marland Kitchen docusate sodium (COLACE) 100 MG capsule Take 100 mg by mouth daily as needed for mild constipation.     Marland Kitchen  estradiol (ESTRACE) 0.1 MG/GM vaginal cream Place 0.5 Applicatorfuls vaginally 2 (two) times a week. 42.5 g 1  . EVENING PRIMROSE OIL PO Take 1 tablet by mouth daily.     Marland Kitchen liothyronine (CYTOMEL) 25 MCG tablet Take 12.5 mcg by mouth 2 (two) times daily.     . Magnesium Citrate 100 MG TABS Take by mouth.    . Methylcobalamin 5000 MCG TBDP Take 1 tablet by mouth daily.     . mirabegron ER (MYRBETRIQ) 25 MG TB24 tablet Take 50 mg by mouth daily.     . nitrofurantoin (MACRODANTIN) 100 MG capsule Take 100 mg by mouth daily. Reported on 09/24/2015    . NON FORMULARY 4 tablets daily. Biotagen    . pantoprazole (PROTONIX) 40 MG tablet Take by mouth.    Marland Kitchen PHOSPHATIDYL CHOLINE PO Take 1 tablet by mouth daily.     . Probiotic Product ([employer] PROBIOTIC MAXIMUM STRENGTH PO) Take 1 tablet by mouth daily.      . Saw Palmetto 160 MG CAPS Take 320 mg by mouth daily.     . SUMAtriptan (IMITREX) 20 MG/ACT nasal spray Place 20 mg into the nose every 2 (two) hours as needed for migraine. 1 Inhaler every two (2) hours as needed.    Marland Kitchen UNABLE TO FIND 30 mg daily. Med Name: [place] 4 tablets daily. Med Name: Lipotropix    . UNABLE TO FIND 5,000 mcg 2 (two) times daily. Med Name: omega pure 820- DR V    . UNABLE TO FIND 2 tablets daily. Med Name: Sharmaine Base    . valACYclovir (VALTREX) 500 MG tablet Take 500 mg by mouth 2 (two) times daily.    . vitamin C (ASCORBIC ACID) 500 MG tablet Take 500 mg by mouth daily.     No current facility-administered medications on file prior to visit.     Allergies  Allergen Reactions  . Other Itching    Dust     . Wheat Bran Other (See Comments)    Extreme fatigue    Social History   Social History  . Marital status: Divorced    Spouse name: N/A  . Number of children: N/A  . Years of education: N/A   Occupational History  . Not on file.   Social History Main Topics  . Smoking status: Never Smoker  . Smokeless tobacco: Never Used  . Alcohol use No     Comment: monthly  . Drug use: No  . Sexual activity: Not Currently    Birth control/ protection: Post-menopausal   Other Topics Concern  . Not on file   Social History Narrative  . No narrative on file    Family History  Problem Relation Age of Onset  . Colon cancer Neg Hx   . Stomach cancer Neg Hx   . Ovarian cancer Neg Hx   . Diabetes Mellitus I Father   . Heart disease Father   . Heart disease Mother   . Diabetes Mellitus I Paternal Aunt   . Breast cancer Paternal Grandmother   . Breast cancer Maternal Grandmother   . Diabetes Mellitus I Maternal Grandfather     The following portions of the patient's history were reviewed and updated as appropriate: allergies, current medications, past family history, past medical history, past social history, past  surgical history and problem list.  Review of Systems Review of Systems  Constitutional: Positive for malaise/fatigue. Negative for chills, fever  and weight loss.  HENT: Negative.   Eyes: Negative.   Respiratory: Positive for shortness of breath.   Cardiovascular:       Dyspnea on exertion  Gastrointestinal: Negative.   Genitourinary:       Urinary retention, status post Botox greater than 1 year ago; self catheterizes 5 times a day  Musculoskeletal: Negative.   Skin: Negative.   Neurological: Positive for weakness.  Endo/Heme/Allergies: Negative.   Psychiatric/Behavioral: Negative.      Objective:   BP 113/70   Pulse 67   Ht 5\' 2"  (1.575 m)   Wt 140 lb 9.6 oz (63.8 kg)   BMI 25.72 kg/m  CONSTITUTIONAL: Well-developed, well-nourished female in no acute distress.  PSYCHIATRIC: Normal mood and affect. Normal behavior. Normal judgment and thought content. [place]: Alert and oriented to person, place, and time. Normal muscle tone coordination. No cranial nerve deficit noted. HENT:  Normocephalic, atraumatic, External right and left ear normal. Oropharynx is clear and moist EYES: Conjunctivae and EOM are normal.. No scleral icterus.  NECK: Normal range of motion, supple, no masses.  Normal thyroid.  SKIN: Skin is warm and dry. No rash noted. Not diaphoretic. No erythema. No pallor. CARDIOVASCULAR: Normal heart rate noted, regular rhythm, no murmur. RESPIRATORY: Clear to auscultation bilaterally. Effort and breath sounds normal, no problems with respiration noted. BREASTS: Symmetric in size. No masses, skin changes, nipple drainage, or lymphadenopathy. Scars noted from prior biopsies. ABDOMEN: Soft, normal bowel sounds, no distention noted.  No tenderness, rebound or guarding.  BLADDER: Normal PELVIC:  External Genitalia: Normal; No evidence of rash, hyperemia, or epithelial skin breakdown  BUS: Normal  Vagina: Mild atrophy; moderate rectocele; no significant  cystocele  Cervix: Normal; no cervical motion tenderness; two 5 mm mucin containing endocervical polyps removed with Tischler forceps  Uterus: Normal; midplane, normal size and shape, nontender  Adnexa: Normal  RV: External Exam NormaI, No Rectal Masses and Normal Sphincter tone  MUSCULOSKELETAL: Normal range of motion. No tenderness.  No cyanosis, clubbing, or edema.  2+ distal pulses. LYMPHATIC: No Axillary, Supraclavicular, or Inguinal Adenopathy.  PROCEDURE: Cervical biopsy Verbal consent is obtained. Patient is placed in dorsal lithotomy position. A Graves' speculum was placed in the vagina to facilitate visualization of the cervix. Tischler biopsy forceps are used to remove 2 mucin containing polyps, each 5 mm in diameter from the endocervical canal; ECC to remove base of polyps is performed with a serrated curette. Blood loss is minimal. Procedure was well-tolerated. No complications are encountered.   Assessment:   Annual gynecologic examination 71 y.o. Contraception: post menopausal status bmi-25 Problem List Items Addressed This Visit    None    Visit Diagnoses    Well woman exam with routine gynecological exam    -  Primary   Screening for colon cancer        Vaginal atrophy Endocervical polyps  Plan:  Pap:not needed Mammogram: ordered Stool Guaiac Testing:  ordered Labs: thru pcp Routine preventative health maintenance measures emphasized: Exercise/Diet/Weight control, Tobacco Warnings and Alcohol/Substance use risks  Cervical biopsy with removal of endocervical polyps completed; results will be made available through my chart Continue using Premarin cream intravaginally once or twice a week Return to [place], CMA  Brayton Mars, MD  Note: This dictation was prepared with Dragon dictation along with smaller phrase technology. Any transcriptional errors that result from this process are unintentional.

## 2016-08-17 ENCOUNTER — Ambulatory Visit (INDEPENDENT_AMBULATORY_CARE_PROVIDER_SITE_OTHER): Payer: Medicare Other | Admitting: Obstetrics and Gynecology

## 2016-08-17 ENCOUNTER — Encounter: Payer: Self-pay | Admitting: Obstetrics and Gynecology

## 2016-08-17 VITALS — BP 113/70 | HR 67 | Ht 62.0 in | Wt 140.6 lb

## 2016-08-17 DIAGNOSIS — Z1211 Encounter for screening for malignant neoplasm of colon: Secondary | ICD-10-CM

## 2016-08-17 DIAGNOSIS — N841 Polyp of cervix uteri: Secondary | ICD-10-CM | POA: Diagnosis not present

## 2016-08-17 DIAGNOSIS — Z01419 Encounter for gynecological examination (general) (routine) without abnormal findings: Secondary | ICD-10-CM

## 2016-08-17 DIAGNOSIS — Z1231 Encounter for screening mammogram for malignant neoplasm of breast: Secondary | ICD-10-CM | POA: Diagnosis not present

## 2016-08-17 DIAGNOSIS — N816 Rectocele: Secondary | ICD-10-CM | POA: Diagnosis not present

## 2016-08-17 DIAGNOSIS — N952 Postmenopausal atrophic vaginitis: Secondary | ICD-10-CM | POA: Diagnosis not present

## 2016-08-17 MED ORDER — ESTRADIOL 0.1 MG/GM VA CREA
0.5000 | TOPICAL_CREAM | VAGINAL | 6 refills | Status: DC
Start: 2016-08-17 — End: 2017-10-03

## 2016-08-17 NOTE — Patient Instructions (Addendum)
1. Pap smear not done 2. Mammogram is obtained through primary care provider 3. Stool guaiac card testing is ordered 4. Screening labs are to be obtained through primary care provider 5. Continue with healthy eating and exercise. 6. Continue with calcium and vitamin D supplementation twice a day 7. Endocervical biopsy is done today. Results will be made available through my chart 8. Return in 1 year for annual exam   Cervical Biopsy, Care After Refer to this sheet in the next few weeks. These instructions provide you with information about caring for yourself after your procedure. Your health care provider may also give you more specific instructions. Your treatment has been planned according to current medical practices, but problems sometimes occur. Call your health care provider if you have any problems or questions after your procedure. What can I expect after the procedure? After the procedure, it is common to have:  Cramping or mild pain for a few days.  Slight bleeding from the vagina for a few days.  Dark-colored vaginal discharge for a few days. Follow these instructions at home:  Take over-the-counter and prescription medicines only as told by your health care provider.  Return to your normal activities as told by your health care provider. Ask your health care provider what activities are safe for you.  Use a sanitary napkin until bleeding and discharge stop.  Do not use tampons until your health care provider approves.  Do not douche until your health care provider approves.  Do not have sex until your health care provider approves.  Keep all follow-up visits as told by your health care provider. This is important. Contact a health care provider if:  You have a fever or chills.  You have bad-smelling vaginal discharge.  You have itching or irritation around the vagina.  You have lower abdominal pain. Get help right away if:  You develop heavy vaginal bleeding  that soaks more than one sanitary pad an hour.  You faint.  You have very bad lower abdominal pain. This information is not intended to replace advice given to you by your health care provider. Make sure you discuss any questions you have with your health care provider. Document Released: 12/02/2014 Document Revised: 08/19/2015 Document Reviewed: 07/29/2014 Elsevier Interactive Patient Education  2017 [place] Maintenance for Postmenopausal Women Menopause is a normal process in which your reproductive ability comes to an end. This process happens gradually over a span of months to years, usually between the ages of 26 and 42. Menopause is complete when you have missed 12 consecutive menstrual periods. It is important to talk with your health care provider about some of the most common conditions that affect postmenopausal women, such as heart disease, cancer, and bone loss (osteoporosis). Adopting a healthy lifestyle and getting preventive care can help to promote your health and wellness. Those actions can also lower your chances of developing some of these common conditions. What should I know about menopause? During menopause, you may experience a number of symptoms, such as:  Moderate-to-severe hot flashes.  Night sweats.  Decrease in sex drive.  Mood swings.  Headaches.  Tiredness.  Irritability.  Memory problems.  Insomnia. Choosing to treat or not to treat menopausal changes is an individual decision that you make with your health care provider. What should I know about hormone replacement therapy and supplements? Hormone therapy products are effective for treating symptoms that are associated with menopause, such as hot flashes and night sweats. Hormone  replacement carries certain risks, especially as you become older. If you are thinking about using estrogen or estrogen with progestin treatments, discuss the benefits and risks with your health care  provider. What should I know about heart disease and stroke? Heart disease, heart attack, and stroke become more likely as you age. This may be due, in part, to the hormonal changes that your body experiences during menopause. These can affect how your body processes dietary fats, triglycerides, and cholesterol. Heart attack and stroke are both medical emergencies. There are many things that you can do to help prevent heart disease and stroke:  Have your blood pressure checked at least every 1-2 years. High blood pressure causes heart disease and increases the risk of stroke.  If you are 40-66 years old, ask your health care provider if you should take aspirin to prevent a heart attack or a stroke.  Do not use any tobacco products, including cigarettes, chewing tobacco, or electronic cigarettes. If you need help quitting, ask your health care provider.  It is important to eat a healthy diet and maintain a healthy weight.  Be sure to include plenty of vegetables, fruits, low-fat dairy products, and lean protein.  Avoid eating foods that are high in solid fats, added sugars, or salt (sodium).  Get regular exercise. This is one of the most important things that you can do for your health.  Try to exercise for at least 150 minutes each week. The type of exercise that you do should increase your heart rate and make you sweat. This is known as moderate-intensity exercise.  Try to do strengthening exercises at least twice each week. Do these in addition to the moderate-intensity exercise.  Know your numbers.Ask your health care provider to check your cholesterol and your blood glucose. Continue to have your blood tested as directed by your health care provider. What should I know about cancer screening? There are several types of cancer. Take the following steps to reduce your risk and to catch any cancer development as early as possible. Breast Cancer  Practice breast self-awareness.  This  means understanding how your breasts normally appear and feel.  It also means doing regular breast self-exams. Let your health care provider know about any changes, no matter how small.  If you are 51 or older, have a clinician do a breast exam (clinical breast exam or CBE) every year. Depending on your age, family history, and medical history, it may be recommended that you also have a yearly breast X-ray (mammogram).  If you have a family history of breast cancer, talk with your health care provider about genetic screening.  If you are at high risk for breast cancer, talk with your health care provider about having an MRI and a mammogram every year.  Breast cancer (BRCA) gene test is recommended for women who have family members with BRCA-related cancers. Results of the assessment will determine the need for genetic counseling and BRCA1 and for BRCA2 testing. BRCA-related cancers include these types:  Breast. This occurs in males or females.  Ovarian.  Tubal. This may also be called fallopian tube cancer.  Cancer of the abdominal or pelvic lining (peritoneal cancer).  Prostate.  Pancreatic. Cervical, Uterine, and Ovarian Cancer  Your health care provider may recommend that you be screened regularly for cancer of the pelvic organs. These include your ovaries, uterus, and vagina. This screening involves a pelvic exam, which includes checking for microscopic changes to the surface of your cervix (Pap test).  For women ages 21-65, health care providers may recommend a pelvic exam and a Pap test every three years. For women ages 54-65, they may recommend the Pap test and pelvic exam, combined with testing for human papilloma virus (HPV), every five years. Some types of HPV increase your risk of cervical cancer. Testing for HPV may also be done on women of any age who have unclear Pap test results.  Other health care providers may not recommend any screening for nonpregnant women who are  considered low risk for pelvic cancer and have no symptoms. Ask your health care provider if a screening pelvic exam is right for you.  If you have had past treatment for cervical cancer or a condition that could lead to cancer, you need Pap tests and screening for cancer for at least 20 years after your treatment. If Pap tests have been discontinued for you, your risk factors (such as having a new sexual partner) need to be reassessed to determine if you should start having screenings again. Some women have medical problems that increase the chance of getting cervical cancer. In these cases, your health care provider may recommend that you have screening and Pap tests more often.  If you have a family history of uterine cancer or ovarian cancer, talk with your health care provider about genetic screening.  If you have vaginal bleeding after reaching menopause, tell your health care provider.  There are currently no reliable tests available to screen for ovarian cancer. Lung Cancer  Lung cancer screening is recommended for adults 84-38 years old who are at high risk for lung cancer because of a history of smoking. A yearly low-dose CT scan of the lungs is recommended if you:  Currently smoke.  Have a history of at least 30 pack-years of smoking and you currently smoke or have quit within the past 15 years. A pack-year is smoking an average of one pack of cigarettes per day for one year. Yearly screening should:  Continue until it has been 15 years since you quit.  Stop if you develop a health problem that would prevent you from having lung cancer treatment. Colorectal Cancer  This type of cancer can be detected and can often be prevented.  Routine colorectal cancer screening usually begins at age 52 and continues through age 19.  If you have risk factors for colon cancer, your health care provider may recommend that you be screened at an earlier age.  If you have a family history of  colorectal cancer, talk with your health care provider about genetic screening.  Your health care provider may also recommend using home test kits to check for hidden blood in your stool.  A small camera at the end of a tube can be used to examine your colon directly (sigmoidoscopy or colonoscopy). This is done to check for the earliest forms of colorectal cancer.  Direct examination of the colon should be repeated every 5-10 years until age 40. However, if early forms of precancerous polyps or small growths are found or if you have a family history or genetic risk for colorectal cancer, you may need to be screened more often. Skin Cancer  Check your skin from head to toe regularly.  Monitor any moles. Be sure to tell your health care provider:  About any new moles or changes in moles, especially if there is a change in a mole's shape or color.  If you have a mole that is larger than the size of a  pencil eraser.  If any of your family members has a history of skin cancer, especially at a young age, talk with your health care provider about genetic screening.  Always use sunscreen. Apply sunscreen liberally and repeatedly throughout the day.  Whenever you are outside, protect yourself by wearing long sleeves, pants, a wide-brimmed hat, and sunglasses. What should I know about osteoporosis? Osteoporosis is a condition in which bone destruction happens more quickly than new bone creation. After menopause, you may be at an increased risk for osteoporosis. To help prevent osteoporosis or the bone fractures that can happen because of osteoporosis, the following is recommended:  If you are 67-56 years old, get at least 1,000 mg of calcium and at least 600 mg of vitamin D per day.  If you are older than age 14 but younger than age 30, get at least 1,200 mg of calcium and at least 600 mg of vitamin D per day.  If you are older than age 4, get at least 1,200 mg of calcium and at least 800 mg of  vitamin D per day. Smoking and excessive alcohol intake increase the risk of osteoporosis. Eat foods that are rich in calcium and vitamin D, and do weight-bearing exercises several times each week as directed by your health care provider. What should I know about how menopause affects my mental health? Depression may occur at any age, but it is more common as you become older. Common symptoms of depression include:  Low or sad mood.  Changes in sleep patterns.  Changes in appetite or eating patterns.  Feeling an overall lack of motivation or enjoyment of activities that you previously enjoyed.  Frequent crying spells. Talk with your health care provider if you think that you are experiencing depression. What should I know about immunizations? It is important that you get and maintain your immunizations. These include:  Tetanus, diphtheria, and pertussis (Tdap) booster vaccine.  Influenza every year before the flu season begins.  Pneumonia vaccine.  Shingles vaccine. Your health care provider may also recommend other immunizations. This information is not intended to replace advice given to you by your health care provider. Make sure you discuss any questions you have with your health care provider. Document Released: 05/05/2005 Document Revised: 10/01/2015 Document Reviewed: 12/15/2014 Elsevier Interactive Patient Education  2017 [employer].

## 2016-08-23 LAB — PATHOLOGY

## 2016-09-13 ENCOUNTER — Ambulatory Visit
Admission: RE | Admit: 2016-09-13 | Discharge: 2016-09-13 | Disposition: A | Discharge status: Home / Self Care | Payer: Medicare Other | Source: Ambulatory Visit | Attending: Obstetrics and Gynecology | Admitting: Obstetrics and Gynecology

## 2016-09-13 DIAGNOSIS — Z1231 Encounter for screening mammogram for malignant neoplasm of breast: Secondary | ICD-10-CM

## 2017-08-22 ENCOUNTER — Encounter: Payer: Medicare Other | Admitting: Obstetrics and Gynecology

## 2017-08-24 NOTE — Progress Notes (Deleted)
Patient ID: Marie Fisher, female   DOB: 1945-06-04, 72 y.o.   MRN: [id number] ANNUAL PREVENTATIVE CARE GYN  ENCOUNTER NOTE  Subjective:       Marie Fisher is a 72 y.o. G3 P3003. female here for a routine annual gynecologic exam.  Current complaints: 1.  Patient does have a moderate cystocele which is asymptomatic. She does have regular bowel movements on a daily basis.      Gynecologic History No LMP recorded. Patient is postmenopausal. Contraception: post menopausal status Last Pap: 06/2015 neg. Results were: normal Last mammogram: 09/13/2016 birad 1 Results were: normal  Obstetric History OB History  Gravida Para Term Preterm AB Living  3 3 3     3   SAB TAB Ectopic Multiple Live Births          3    # Outcome Date GA Lbr Len/2nd Weight Sex Delivery Anes PTL Lv  3 Term 1987   9 lb 11.2 oz (4.4 kg) M Vag-Spont   LIV  2 Term 1978   7 lb 9.6 oz (3.447 kg) M Vag-Spont   LIV  1 Term 1975   7 lb 2.1 oz (3.234 kg) M Vag-Spont   LIV    Past Medical History:  Diagnosis Date  . Allergy   . Anemia   . Arthritis   . Asthma   . Cardiomyopathy ([place])   . CHF (congestive heart failure) ([place])   . 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 Depression   . Fibrocystic breast 08/10/2014  . GERD (gastroesophageal reflux disease) 08/10/2014  . Headache   . Hyperlipidemia 08/10/2014  . Hypertension   . OAB (overactive bladder) 08/10/2014  . 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 Stroke ([place]) 2010   mini  . SVT (supraventricular tachycardia) ([place])   . Urinary tract infection    recurrent  . Vaginal atrophy 08/10/2014  . 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 ABLATION N/A    Cardiac  . BLADDER SURGERY    . BREAST BIOPSY Bilateral    x3  . CARPAL TUNNEL RELEASE     bil.  . CERVICAL SPINE SURGERY  1990  . ESOPHAGOGASTRODUODENOSCOPY (EGD) WITH PROPOFOL N/A 09/24/2015   Procedure:  ESOPHAGOGASTRODUODENOSCOPY (EGD) WITH PROPOFOL;  Surgeon: Lollie Sails, MD;  Location: [facility] ENDOSCOPY;  Service: Endoscopy;  Laterality: N/A;  . NECK SURGERY     from front  . tension free vaginal tape      Current Outpatient Medications on File Prior to Visit  Medication Sig Dispense Refill  . amLODipine (NORVASC) 2.5 MG tablet Take by mouth.    Marland Kitchen aspirin 81 MG tablet Take 81 mg by mouth daily.    . bisoprolol (ZEBETA) 5 MG tablet Take 5 mg by mouth daily. Reported on 09/23/2015    . buPROPion (WELLBUTRIN XL) 300 MG 24 hr tablet Take 300 mg by mouth daily.    . Calcium Citrate 200 MG TABS Take 300 mg by mouth.    . candesartan (ATACAND) 32 MG tablet Take 32 mg by mouth daily.    . cholecalciferol (BIO-D-MULSION) 400 UNT/0.03ML LIQD Take 0.06 mL by mouth daily.    . Cranberry-Vitamin C (AZO CRANBERRY URINARY TRACT PO)     . DHEA 25 MG CAPS Take 5 mg by mouth daily.     Marland Kitchen docusate sodium (COLACE) 100 MG capsule Take 100 mg by mouth 2 (two) times daily.    Marland Kitchen  estradiol (ESTRACE) 0.1 MG/GM vaginal cream Place 0.5 Applicatorfuls vaginally 2 (two) times a week. 42.5 g 6  . Evening Primrose Oil 1000 MG CAPS     . Ipratropium-Albuterol (COMBIVENT) 20-100 MCG/ACT AERS respimat Inhale into the lungs.    Marland Kitchen liothyronine (CYTOMEL) 25 MCG tablet Take 12.5 mcg by mouth 2 (two) times daily.     . Magnesium Citrate 100 MG TABS Take by mouth.    . pantoprazole (PROTONIX) 40 MG tablet Take by mouth.    Marland Kitchen PHOSPHATIDYL CHOLINE PO Take 1 tablet by mouth daily.     . Probiotic Product (RA PROBIOTIC MAX STRENGTH PO) Take by mouth.    . Saw Palmetto 1000 MG CAPS Take by mouth.    . SUMAtriptan (IMITREX) 20 MG/ACT nasal spray Place 20 mg into the nose every 2 (two) hours as needed for migraine. 1 Inhaler every two (2) hours as needed.    . TRACE MINERALS CR-CU-MN-ZN IV Inject into the vein.    Marland Kitchen UNABLE TO FIND 4 tablets daily. Med Name: Lipotropix    . UNABLE TO FIND 5,000 mcg 2 (two) times daily. Med Name:  omega pure 820- DR V    . UNABLE TO FIND 2 tablets daily. Med Name: Raynelle Bring TO FIND Med Name: bio-ae    . UNABLE TO FIND Med Name: biotagen    . UNABLE TO FIND Med Name: boro tab    . UNABLE TO FIND Med Name: gluterase    . UNABLE TO FIND Med Name: memorall    . UNABLE TO FIND Med Name: methyl protect    . valACYclovir (VALTREX) 500 MG tablet Take 500 mg by mouth 2 (two) times daily.    . vitamin C (ASCORBIC ACID) 500 MG tablet Take 500 mg by mouth daily.     No current facility-administered medications on file prior to visit.     Allergies  Allergen Reactions  . Other Itching    Reaction unknonwn Dust   And dust mites, grass , ragweed  . Wheat Bran Other (See Comments)    Extreme fatigue    Social History   Socioeconomic History  . Marital status: Divorced    Spouse name: Not on file  . Number of children: Not on file  . Years of education: Not on file  . Highest education level: Not on file  Occupational History  . Not on file  Social Needs  . Financial resource strain: Not on file  . Food insecurity:    Worry: Not on file    Inability: Not on file  . Transportation needs:    Medical: Not on file    Non-medical: Not on file  Tobacco Use  . Smoking status: Never Smoker  . Smokeless tobacco: Never Used  Substance and Sexual Activity  . Alcohol use: Yes    Comment: rare  . Drug use: No  . Sexual activity: Not Currently    Birth control/protection: Post-menopausal  Lifestyle  . Physical activity:    Days per week: Not on file    Minutes per session: Not on file  . Stress: Not on file  Relationships  . Social connections:    Talks on phone: Not on file    Gets together: Not on file    Attends religious service: Not on file    Active member of club or organization: Not on file    Attends meetings of clubs or organizations: Not on file  Relationship status: Not on file  . Intimate partner violence:    Fear of current or ex partner: Not  on file    Emotionally abused: Not on file    Physically abused: Not on file    Forced sexual activity: Not on file  Other Topics Concern  . Not on file  Social History Narrative  . Not on file    Family History  Problem Relation Age of Onset  . Diabetes Mellitus I Father   . Heart disease Father   . Heart disease Mother   . Diabetes Mellitus I Paternal Aunt   . Breast cancer Paternal Grandmother   . Breast cancer Maternal Grandmother   . Diabetes Mellitus I Maternal Grandfather   . Colon cancer Neg Hx   . Stomach cancer Neg Hx   . Ovarian cancer Neg Hx     The following portions of the patient's history were reviewed and updated as appropriate: allergies, current medications, past family history, past medical history, past social history, past surgical history and problem list.  Review of Systems    Objective:   There were no vitals taken for this visit. CONSTITUTIONAL: Well-developed, well-nourished female in no acute distress.  PSYCHIATRIC: Normal mood and affect. Normal behavior. Normal judgment and thought content. [place]: Alert and oriented to person, place, and time. Normal muscle tone coordination. No cranial nerve deficit noted. HENT:  Normocephalic, atraumatic, External right and left ear normal. Oropharynx is clear and moist EYES: Conjunctivae and EOM are normal.. No scleral icterus.  NECK: Normal range of motion, supple, no masses.  Normal thyroid.  SKIN: Skin is warm and dry. No rash noted. Not diaphoretic. No erythema. No pallor. CARDIOVASCULAR: Normal heart rate noted, regular rhythm, no murmur. RESPIRATORY: Clear to auscultation bilaterally. Effort and breath sounds normal, no problems with respiration noted. BREASTS: Symmetric in size. No masses, skin changes, nipple drainage, or lymphadenopathy. Scars noted from prior biopsies. ABDOMEN: Soft, normal bowel sounds, no distention noted.  No tenderness, rebound or guarding.  BLADDER:  Normal PELVIC:  External Genitalia: Normal; No evidence of rash, hyperemia, or epithelial skin breakdown  BUS: Normal  Vagina: Mild atrophy; moderate rectocele; no significant cystocele  Cervix: Normal; no cervical motion tenderness; two 5 mm mucin containing endocervical polyps removed with Tischler forceps  Uterus: Normal; midplane, normal size and shape, nontender  Adnexa: Normal  RV: External Exam NormaI, No Rectal Masses and Normal Sphincter tone  MUSCULOSKELETAL: Normal range of motion. No tenderness.  No cyanosis, clubbing, or edema.  2+ distal pulses. LYMPHATIC: No Axillary, Supraclavicular, or Inguinal Adenopathy.  PROCEDURE: Cervical biopsy Verbal consent is obtained. Patient is placed in dorsal lithotomy position. A Graves' speculum was placed in the vagina to facilitate visualization of the cervix. Tischler biopsy forceps are used to remove 2 mucin containing polyps, each 5 mm in diameter from the endocervical canal; ECC to remove base of polyps is performed with a serrated curette. Blood loss is minimal. Procedure was well-tolerated. No complications are encountered.   Assessment:   Annual gynecologic examination 72 y.o. Contraception: post menopausal status bmi-25 Problem List Items Addressed This Visit    Vaginal atrophy   Rectocele    Other Visit Diagnoses    Well woman exam with routine gynecological exam    -  Primary   Screening for colon cancer       Encounter for screening mammogram for breast cancer        Vaginal atrophy Endocervical polyps  Plan:  Pap:not needed  Mammogram: ordered Stool Guaiac Testing:  ordered Labs: thru pcp Routine preventative health maintenance measures emphasized: Exercise/Diet/Weight control, Tobacco Warnings and Alcohol/Substance use risks  Continue using Premarin cream intravaginally once or twice a week Return to [place]   Note: This dictation was prepared with Dragon dictation along with  smaller phrase technology. Any transcriptional errors that result from this process are unintentional.

## 2017-08-28 ENCOUNTER — Encounter: Payer: Medicare Other | Admitting: Obstetrics and Gynecology

## 2017-08-28 ENCOUNTER — Other Ambulatory Visit: Payer: Self-pay | Admitting: Internal Medicine

## 2017-08-28 DIAGNOSIS — Z1231 Encounter for screening mammogram for malignant neoplasm of breast: Secondary | ICD-10-CM

## 2017-09-25 ENCOUNTER — Ambulatory Visit
Admission: RE | Admit: 2017-09-25 | Discharge: 2017-09-25 | Disposition: A | Discharge status: Home / Self Care | Payer: Medicare Other | Source: Ambulatory Visit | Attending: Internal Medicine | Admitting: Internal Medicine

## 2017-09-25 DIAGNOSIS — Z1231 Encounter for screening mammogram for malignant neoplasm of breast: Secondary | ICD-10-CM | POA: Diagnosis not present

## 2017-10-01 NOTE — Progress Notes (Signed)
Patient ID: Marie Fisher, female   DOB: 03/18/1946, 72 y.o.   MRN: [id number] ANNUAL PREVENTATIVE CARE GYN  ENCOUNTER NOTE  Subjective:       Marie Fisher is a 72 y.o. G3 P3003. female here for a routine annual gynecologic exam.  Current complaints:  1. Bloating- x 6 monhts; patient has noted slight increase in weight; no significant change in appetite; no change in clothing size; no history of fever, chills, night sweats; no change in bowel function; overactive bladder symptoms persist-patient is scheduled to have InterStim implant  Patient does have a moderate rectocele which is minimally symptomatic. She does have regular bowel movements on a daily basis with only occasional bouts of constipation.  Marie Fisher is interested in alternative forms of vaginal estrogen therapy.  She is to satisfied with the messiness and cost of the Estrace vaginal cream  Gynecologic History No LMP recorded. Patient is postmenopausal. Contraception: post menopausal status Last Pap: 06/2015 neg. Results were: normal Last mammogram: 09/25/2017 birad 1 Results were: normal  Obstetric History OB History  Gravida Para Term Preterm AB Living  3 3 3     3   SAB TAB Ectopic Multiple Live Births          3    # Outcome Date GA Lbr Len/2nd Weight Sex Delivery Anes PTL Lv  3 Term 1987   9 lb 11.2 oz (4.4 kg) M Vag-Spont   LIV  2 Term 1978   7 lb 9.6 oz (3.447 kg) M Vag-Spont   LIV  1 Term 1975   7 lb 2.1 oz (3.234 kg) M Vag-Spont   LIV    Past Medical History:  Diagnosis Date  . Allergy   . Anemia   . Arthritis   . Asthma   . Cardiomyopathy ([place])   . CHF (congestive heart failure) ([place])   . 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 Depression   . Fibrocystic breast 08/10/2014  . GERD (gastroesophageal reflux disease) 08/10/2014  . Headache   . Hyperlipidemia 08/10/2014  . Hypertension   . OAB (overactive bladder) 08/10/2014  . 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 Stroke  ([place]) 2010   mini  . SVT (supraventricular tachycardia) ([place])   . Urinary tract infection    recurrent  . Vaginal atrophy 08/10/2014  . 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 ABLATION N/A    Cardiac  . BLADDER SURGERY    . BREAST BIOPSY Bilateral    x3  . CARPAL TUNNEL RELEASE     bil.  . CERVICAL SPINE SURGERY  1990  . ESOPHAGOGASTRODUODENOSCOPY (EGD) WITH PROPOFOL N/A 09/24/2015   Procedure: ESOPHAGOGASTRODUODENOSCOPY (EGD) WITH PROPOFOL;  Surgeon: Lollie Sails, MD;  Location[facility] ENDOSCOPY;  Service: Endoscopy;  Laterality: N/A;  . NECK SURGERY     from front  . tension free vaginal tape      Current Outpatient Medications on File Prior to Visit  Medication Sig Dispense Refill  . amLODipine (NORVASC) 2.5 MG tablet Take by mouth.    Marland Kitchen aspirin 81 MG tablet Take 81 mg by mouth daily.    . bisoprolol (ZEBETA) 5 MG tablet Take 5 mg by mouth daily. Reported on 09/23/2015    . buPROPion (WELLBUTRIN XL) 300 MG 24 hr tablet Take 300 mg by mouth daily.    . Calcium Citrate 200 MG TABS Take 300 mg by mouth.    . candesartan (  ATACAND) 32 MG tablet Take 32 mg by mouth daily.    . cholecalciferol (BIO-D-MULSION) 400 UNT/0.03ML LIQD Take 0.06 mL by mouth daily.    . Cranberry-Vitamin C (AZO CRANBERRY URINARY TRACT PO)     . DHEA 25 MG CAPS Take 5 mg by mouth daily.     Marland Kitchen docusate sodium (COLACE) 100 MG capsule Take 100 mg by mouth 2 (two) times daily.    Marland Kitchen estradiol (ESTRACE) 0.1 MG/GM vaginal cream Place 0.5 Applicatorfuls vaginally 2 (two) times a week. 42.5 g 6  . Evening Primrose Oil 1000 MG CAPS     . Ipratropium-Albuterol (COMBIVENT) 20-100 MCG/ACT AERS respimat Inhale into the lungs.    Marland Kitchen liothyronine (CYTOMEL) 25 MCG tablet Take 12.5 mcg by mouth 2 (two) times daily.     . Magnesium Citrate 100 MG TABS Take by mouth.    . pantoprazole (PROTONIX) 40 MG tablet Take by mouth.    Marland Kitchen PHOSPHATIDYL CHOLINE PO Take 1 tablet by mouth  daily.     . Probiotic Product (RA PROBIOTIC MAX STRENGTH PO) Take by mouth.    . Saw Palmetto 1000 MG CAPS Take by mouth.    . SUMAtriptan (IMITREX) 20 MG/ACT nasal spray Place 20 mg into the nose every 2 (two) hours as needed for migraine. 1 Inhaler every two (2) hours as needed.    . TRACE MINERALS CR-CU-MN-ZN IV Inject into the vein.    Marland Kitchen UNABLE TO FIND 4 tablets daily. Med Name: Lipotropix    . UNABLE TO FIND 5,000 mcg 2 (two) times daily. Med Name: omega pure 820- DR V    . UNABLE TO FIND 2 tablets daily. Med Name: Raynelle Bring TO FIND Med Name: bio-ae    . UNABLE TO FIND Med Name: biotagen    . UNABLE TO FIND Med Name: boro tab    . UNABLE TO FIND Med Name: gluterase    . UNABLE TO FIND Med Name: memorall    . UNABLE TO FIND Med Name: methyl protect    . valACYclovir (VALTREX) 500 MG tablet Take 500 mg by mouth 2 (two) times daily.    . vitamin C (ASCORBIC ACID) 500 MG tablet Take 500 mg by mouth daily.     No current facility-administered medications on file prior to visit.     Allergies  Allergen Reactions  . Other Itching    Reaction unknonwn Dust   And dust mites, grass , ragweed  . Wheat Bran Other (See Comments)    Extreme fatigue    Social History   Socioeconomic History  . Marital status: Divorced    Spouse name: Not on file  . Number of children: Not on file  . Years of education: Not on file  . Highest education level: Not on file  Occupational History  . Not on file  Social Needs  . Financial resource strain: Not on file  . Food insecurity:    Worry: Not on file    Inability: Not on file  . Transportation needs:    Medical: Not on file    Non-medical: Not on file  Tobacco Use  . Smoking status: Never Smoker  . Smokeless tobacco: Never Used  Substance and Sexual Activity  . Alcohol use: Yes    Comment: rare  . Drug use: No  . Sexual activity: Not Currently    Birth control/protection: Post-menopausal  Lifestyle  . Physical  activity:    Days per week:  Not on file    Minutes per session: Not on file  . Stress: Not on file  Relationships  . Social connections:    Talks on phone: Not on file    Gets together: Not on file    Attends religious service: Not on file    Active member of club or organization: Not on file    Attends meetings of clubs or organizations: Not on file    Relationship status: Not on file  . Intimate partner violence:    Fear of current or ex partner: Not on file    Emotionally abused: Not on file    Physically abused: Not on file    Forced sexual activity: Not on file  Other Topics Concern  . Not on file  Social History Narrative  . Not on file    Family History  Problem Relation Age of Onset  . Diabetes Mellitus I Father   . Heart disease Father   . Heart disease Mother   . Diabetes Mellitus I Paternal Aunt   . Breast cancer Paternal Grandmother   . Breast cancer Maternal Grandmother   . Diabetes Mellitus I Maternal Grandfather   . Colon cancer Neg Hx   . Stomach cancer Neg Hx   . Ovarian cancer Neg Hx     The following portions of the patient's history were reviewed and updated as appropriate: allergies, current medications, past family history, past medical history, past social history, past surgical history and problem list.  Review of Systems Review of Systems  Constitutional: Negative.        No significant vasomotor symptoms  HENT: Negative.   Eyes: Negative.   Respiratory: Negative.   Cardiovascular: Negative.   Gastrointestinal: Positive for constipation. Negative for abdominal pain, nausea and vomiting.  Genitourinary: Positive for frequency and urgency. Negative for dysuria and flank pain.       Overactive bladder; scheduled for InterStim placement  Musculoskeletal: Negative.   Skin: Negative.   Neurological: Negative.   Endo/Heme/Allergies: Negative.   Psychiatric/Behavioral: Negative.       Objective:   BP 124/79   Pulse 73   Ht 5\' 2"  (1.575 m)    Wt 144 lb 3.2 oz (65.4 kg)   BMI 26.37 kg/m  CONSTITUTIONAL: Well-developed, well-nourished female in no acute distress.  PSYCHIATRIC: Normal mood and affect. Normal behavior. Normal judgment and thought content. [place]: Alert and oriented to person, place, and time. Normal muscle tone coordination. No cranial nerve deficit noted. HENT:  Normocephalic, atraumatic, External right and left ear normal.  EYES: Conjunctivae and EOM are normal.. No scleral icterus.  NECK: Normal range of motion, supple, no masses.  Normal thyroid.  SKIN: Skin is warm and dry. No rash noted. Not diaphoretic. No erythema. No pallor. CARDIOVASCULAR: Normal heart rate noted, regular rhythm, no murmur. RESPIRATORY: Clear to auscultation bilaterally. Effort and breath sounds normal, no problems with respiration noted. BREASTS: Symmetric in size. No masses, skin changes, nipple drainage, or lymphadenopathy. Scars noted from prior biopsies. ABDOMEN: Soft, no distention noted.  No tenderness, rebound or guarding.  BLADDER: Normal PELVIC:  External Genitalia: Normal; No evidence of rash, hyperemia, or epithelial skin breakdown  BUS: Normal  Vagina: Mild atrophy; moderate rectocele; first-degree cystocele  Cervix: Normal; no cervical motion tenderness  Uterus: Normal; midplane, normal size and shape, nontender  Adnexa: Left lower quadrant with fullness/questionable ovarian enlargement 4 cm; right lower quadrant unremarkable  RV: External Exam NormaI, No Rectal Masses and Normal Sphincter tone.  Granular  soft stool in rectal vault MUSCULOSKELETAL: Normal range of motion. No tenderness.  No cyanosis, clubbing, or edema.  2+ distal pulses. LYMPHATIC: No Axillary, Supraclavicular, or Inguinal Adenopathy.   Assessment:   Annual gynecologic examination 72 y.o. Contraception: post menopausal status bmi-26 Problem List Items Addressed This Visit    Vaginal atrophy   Rectocele    Other Visit Diagnoses    Well woman  exam with routine gynecological exam    -  Primary   Screening for colon cancer       Encounter for screening mammogram for breast cancer        Vaginal atrophy, with dissatisfaction regarding estrogen cream Minimally symptomatic rectocele Abdominal bloating with left lower quadrant fullness on pelvic exam, unclear etiology   Plan:  Pap:not needed Mammogram: utd Stool Guaiac Testing:  ordered Labs: thru pcp Ultrasound for assessment of abdominal bloating and left lower quadrant fullness Routine preventative health maintenance measures emphasized: Exercise/Diet/Weight control, Tobacco Warnings and Alcohol/Substance use risks  Stop Estrace cream Trial of bioidentical estrogen tablets intravaginal twice weekly 0.2 mg/g Return to Clinic - [place], CMA  Brayton Mars, MD   Note: This dictation was prepared with Dragon dictation along with smaller phrase technology. Any transcriptional errors that result from this process are unintentional.

## 2017-10-03 ENCOUNTER — Other Ambulatory Visit (INDEPENDENT_AMBULATORY_CARE_PROVIDER_SITE_OTHER): Payer: Medicare Other

## 2017-10-03 ENCOUNTER — Encounter: Payer: Self-pay | Admitting: Obstetrics and Gynecology

## 2017-10-03 ENCOUNTER — Ambulatory Visit (INDEPENDENT_AMBULATORY_CARE_PROVIDER_SITE_OTHER): Payer: Medicare Other | Admitting: Obstetrics and Gynecology

## 2017-10-03 VITALS — BP 124/79 | HR 73 | Ht 62.0 in | Wt 144.2 lb

## 2017-10-03 DIAGNOSIS — N816 Rectocele: Secondary | ICD-10-CM | POA: Diagnosis not present

## 2017-10-03 DIAGNOSIS — R140 Abdominal distension (gaseous): Secondary | ICD-10-CM

## 2017-10-03 DIAGNOSIS — Z1231 Encounter for screening mammogram for malignant neoplasm of breast: Secondary | ICD-10-CM

## 2017-10-03 DIAGNOSIS — Z01419 Encounter for gynecological examination (general) (routine) without abnormal findings: Secondary | ICD-10-CM

## 2017-10-03 DIAGNOSIS — Z1211 Encounter for screening for malignant neoplasm of colon: Secondary | ICD-10-CM

## 2017-10-03 DIAGNOSIS — N952 Postmenopausal atrophic vaginitis: Secondary | ICD-10-CM | POA: Diagnosis not present

## 2017-10-03 MED ORDER — ESTRADIOL 0.1 MG/GM VA CREA: 0.5000 | TOPICAL_CREAM | VAGINAL | 6 refills | Status: AC

## 2017-10-03 NOTE — Patient Instructions (Addendum)
1.  Pap smear is not done.  No further Paps are needed 2.  Mammogram screening is up-to-date 3.  Stool guaiac cards are given for colon cancer screening 4.  Compounded estradiol tablets 0.2 mg/g intravaginal twice a week is prescribed; number 30 g; 4 refills 5.  Continue with healthy eating and exercise 6.  Recommend calcium with vitamin D supplementation twice a day 7.  Return in 1 year for annual exam 8.  Pelvic ultrasound is ordered to assess abdominal bloating and left adnexal fullness  Health Maintenance for Postmenopausal Women Menopause is a normal process in which your reproductive ability comes to an end. This process happens gradually over a span of months to years, usually between the ages of 39 and 25. Menopause is complete when you have missed 12 consecutive menstrual periods. It is important to talk with your health care provider about some of the most common conditions that affect postmenopausal women, such as heart disease, cancer, and bone loss (osteoporosis). Adopting a healthy lifestyle and getting preventive care can help to promote your health and wellness. Those actions can also lower your chances of developing some of these common conditions. What should I know about menopause? During menopause, you may experience a number of symptoms, such as:  Moderate-to-severe hot flashes.  Night sweats.  Decrease in sex drive.  Mood swings.  Headaches.  Tiredness.  Irritability.  Memory problems.  Insomnia.  Choosing to treat or not to treat menopausal changes is an individual decision that you make with your health care provider. What should I know about hormone replacement therapy and supplements? Hormone therapy products are effective for treating symptoms that are associated with menopause, such as hot flashes and night sweats. Hormone replacement carries certain risks, especially as you become older. If you are thinking about using estrogen or estrogen with  progestin treatments, discuss the benefits and risks with your health care provider. What should I know about heart disease and stroke? Heart disease, heart attack, and stroke become more likely as you age. This may be due, in part, to the hormonal changes that your body experiences during menopause. These can affect how your body processes dietary fats, triglycerides, and cholesterol. Heart attack and stroke are both medical emergencies. There are many things that you can do to help prevent heart disease and stroke:  Have your blood pressure checked at least every 1-2 years. High blood pressure causes heart disease and increases the risk of stroke.  If you are 63-75 years old, ask your health care provider if you should take aspirin to prevent a heart attack or a stroke.  Do not use any tobacco products, including cigarettes, chewing tobacco, or electronic cigarettes. If you need help quitting, ask your health care provider.  It is important to eat a healthy diet and maintain a healthy weight. ? Be sure to include plenty of vegetables, fruits, low-fat dairy products, and lean protein. ? Avoid eating foods that are high in solid fats, added sugars, or salt (sodium).  Get regular exercise. This is one of the most important things that you can do for your health. ? Try to exercise for at least 150 minutes each week. The type of exercise that you do should increase your heart rate and make you sweat. This is known as moderate-intensity exercise. ? Try to do strengthening exercises at least twice each week. Do these in addition to the moderate-intensity exercise.  Know your numbers.Ask your health care provider to check your cholesterol and  your blood glucose. Continue to have your blood tested as directed by your health care provider.  What should I know about cancer screening? There are several types of cancer. Take the following steps to reduce your risk and to catch any cancer development as  early as possible. Breast Cancer  Practice breast self-awareness. ? This means understanding how your breasts normally appear and feel. ? It also means doing regular breast self-exams. Let your health care provider know about any changes, no matter how small.  If you are 84 or older, have a clinician do a breast exam (clinical breast exam or CBE) every year. Depending on your age, family history, and medical history, it may be recommended that you also have a yearly breast X-ray (mammogram).  If you have a family history of breast cancer, talk with your health care provider about genetic screening.  If you are at high risk for breast cancer, talk with your health care provider about having an MRI and a mammogram every year.  Breast cancer (BRCA) gene test is recommended for women who have family members with BRCA-related cancers. Results of the assessment will determine the need for genetic counseling and BRCA1 and for BRCA2 testing. BRCA-related cancers include these types: ? Breast. This occurs in males or females. ? Ovarian. ? Tubal. This may also be called fallopian tube cancer. ? Cancer of the abdominal or pelvic lining (peritoneal cancer). ? Prostate. ? Pancreatic.  Cervical, Uterine, and Ovarian Cancer Your health care provider may recommend that you be screened regularly for cancer of the pelvic organs. These include your ovaries, uterus, and vagina. This screening involves a pelvic exam, which includes checking for microscopic changes to the surface of your cervix (Pap test).  For women ages 21-65, health care providers may recommend a pelvic exam and a Pap test every three years. For women ages 12-65, they may recommend the Pap test and pelvic exam, combined with testing for human papilloma virus (HPV), every five years. Some types of HPV increase your risk of cervical cancer. Testing for HPV may also be done on women of any age who have unclear Pap test results.  Other health  care providers may not recommend any screening for nonpregnant women who are considered low risk for pelvic cancer and have no symptoms. Ask your health care provider if a screening pelvic exam is right for you.  If you have had past treatment for cervical cancer or a condition that could lead to cancer, you need Pap tests and screening for cancer for at least 20 years after your treatment. If Pap tests have been discontinued for you, your risk factors (such as having a new sexual partner) need to be reassessed to determine if you should start having screenings again. Some women have medical problems that increase the chance of getting cervical cancer. In these cases, your health care provider may recommend that you have screening and Pap tests more often.  If you have a family history of uterine cancer or ovarian cancer, talk with your health care provider about genetic screening.  If you have vaginal bleeding after reaching menopause, tell your health care provider.  There are currently no reliable tests available to screen for ovarian cancer.  Lung Cancer Lung cancer screening is recommended for adults 60-80 years old who are at high risk for lung cancer because of a history of smoking. A yearly low-dose CT scan of the lungs is recommended if you:  Currently smoke.  Have a history  of at least 30 pack-years of smoking and you currently smoke or have quit within the past 15 years. A pack-year is smoking an average of one pack of cigarettes per day for one year.  Yearly screening should:  Continue until it has been 15 years since you quit.  Stop if you develop a health problem that would prevent you from having lung cancer treatment.  Colorectal Cancer  This type of cancer can be detected and can often be prevented.  Routine colorectal cancer screening usually begins at age 58 and continues through age 83.  If you have risk factors for colon cancer, your health care provider may  recommend that you be screened at an earlier age.  If you have a family history of colorectal cancer, talk with your health care provider about genetic screening.  Your health care provider may also recommend using home test kits to check for hidden blood in your stool.  A small camera at the end of a tube can be used to examine your colon directly (sigmoidoscopy or colonoscopy). This is done to check for the earliest forms of colorectal cancer.  Direct examination of the colon should be repeated every 5-10 years until age 44. However, if early forms of precancerous polyps or small growths are found or if you have a family history or genetic risk for colorectal cancer, you may need to be screened more often.  Skin Cancer  Check your skin from head to toe regularly.  Monitor any moles. Be sure to tell your health care provider: ? About any new moles or changes in moles, especially if there is a change in a mole's shape or color. ? If you have a mole that is larger than the size of a pencil eraser.  If any of your family members has a history of skin cancer, especially at a young age, talk with your health care provider about genetic screening.  Always use sunscreen. Apply sunscreen liberally and repeatedly throughout the day.  Whenever you are outside, protect yourself by wearing long sleeves, pants, a wide-brimmed hat, and sunglasses.  What should I know about osteoporosis? Osteoporosis is a condition in which bone destruction happens more quickly than new bone creation. After menopause, you may be at an increased risk for osteoporosis. To help prevent osteoporosis or the bone fractures that can happen because of osteoporosis, the following is recommended:  If you are 5-58 years old, get at least 1,000 mg of calcium and at least 600 mg of vitamin D per day.  If you are older than age 45 but younger than age 73, get at least 1,200 mg of calcium and at least 600 mg of vitamin D per  day.  If you are older than age 21, get at least 1,200 mg of calcium and at least 800 mg of vitamin D per day.  Smoking and excessive alcohol intake increase the risk of osteoporosis. Eat foods that are rich in calcium and vitamin D, and do weight-bearing exercises several times each week as directed by your health care provider. What should I know about how menopause affects my mental health? Depression may occur at any age, but it is more common as you become older. Common symptoms of depression include:  Low or sad mood.  Changes in sleep patterns.  Changes in appetite or eating patterns.  Feeling an overall lack of motivation or enjoyment of activities that you previously enjoyed.  Frequent crying spells.  Talk with your health care provider  if you think that you are experiencing depression. What should I know about immunizations? It is important that you get and maintain your immunizations. These include:  Tetanus, diphtheria, and pertussis (Tdap) booster vaccine.  Influenza every year before the flu season begins.  Pneumonia vaccine.  Shingles vaccine.  Your health care provider may also recommend other immunizations. This information is not intended to replace advice given to you by your health care provider. Make sure you discuss any questions you have with your health care provider. Document Released: 05/05/2005 Document Revised: 10/01/2015 Document Reviewed: 12/15/2014 Elsevier Interactive Patient Education  2018 [employer]  

## 2018-02-19 IMAGING — CR DG CHEST 1V PORT
1 series · 1 of 1 positions shown · non-contrast
Comparison: June 09, 2003

CLINICAL DATA: Cardiac arrhythmia.  Hypertension.

EXAM:
PORTABLE CHEST 1 VIEW

[AP]
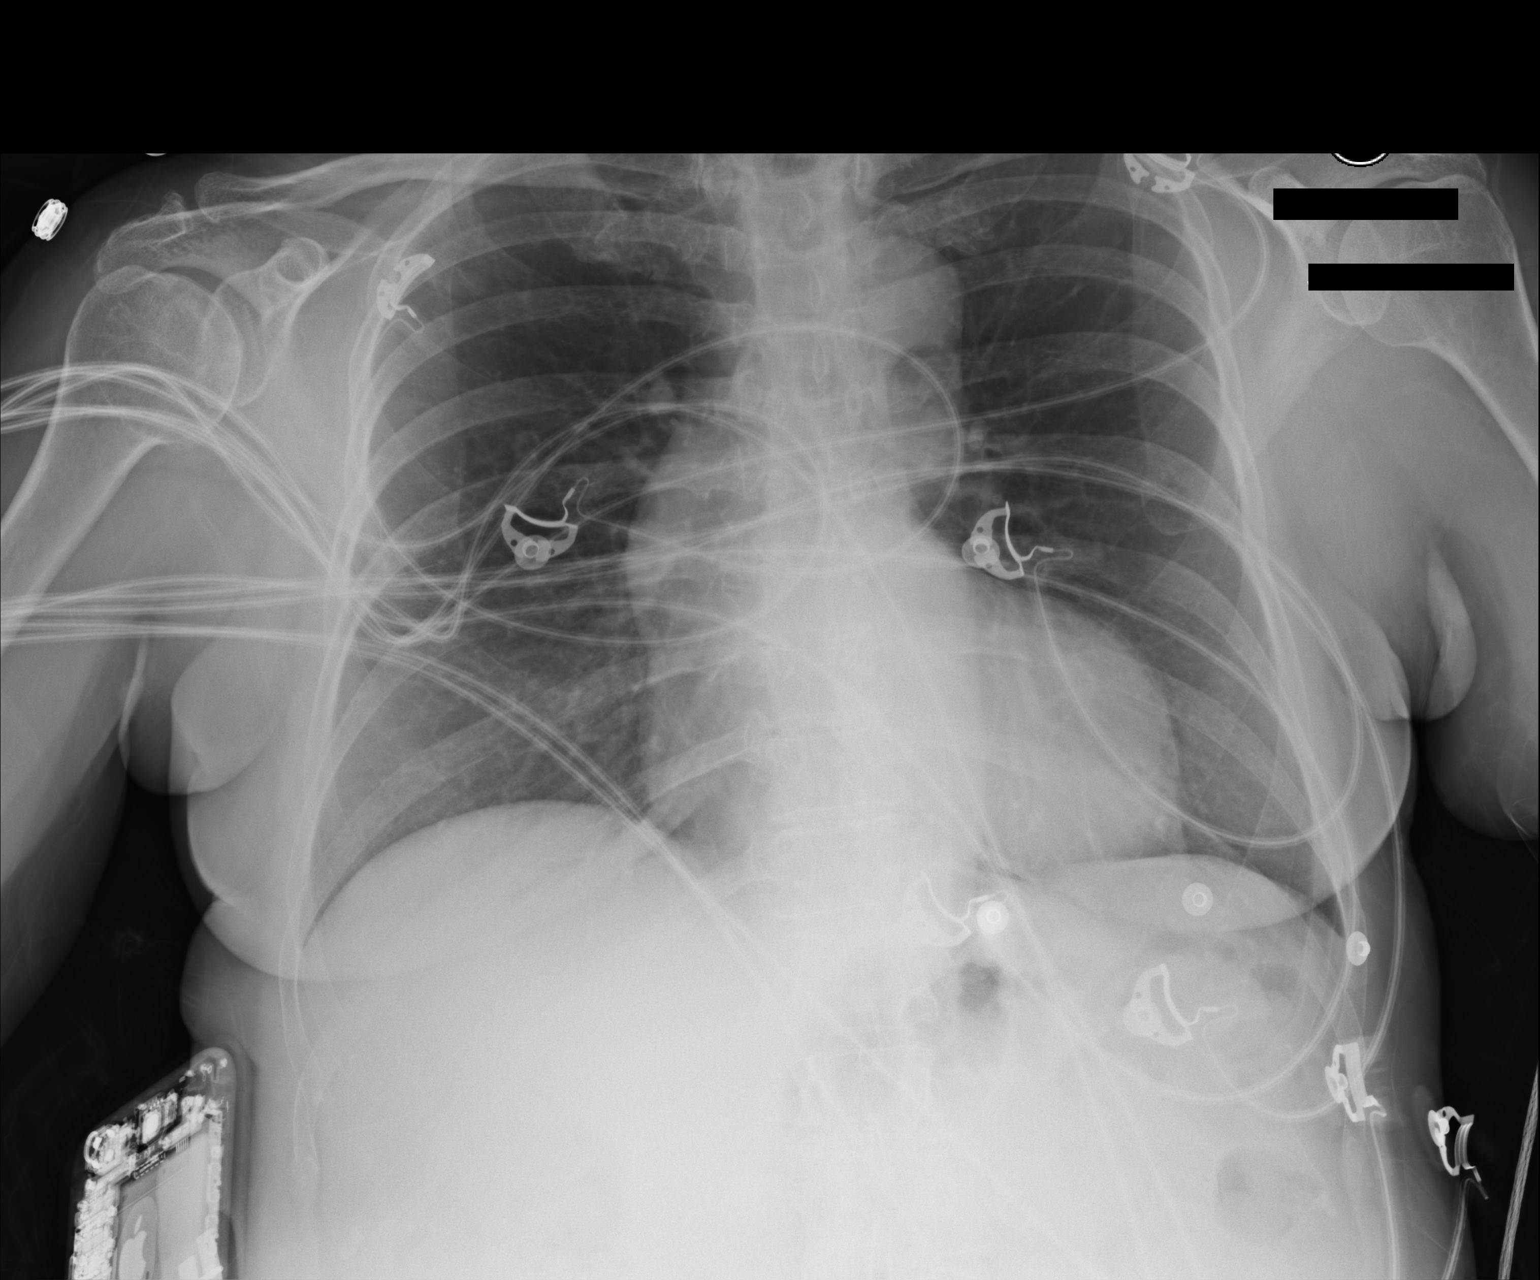

[1 of 1 positions shown; findings below may reference images not displayed]

FINDINGS: No edema or consolidation. Heart is upper normal in size with
pulmonary vascularity within normal limits. No adenopathy. There is
postoperative change in the lower cervical region.
IMPRESSION: No edema or consolidation.

## 2018-04-19 ENCOUNTER — Encounter: Payer: Self-pay | Admitting: Adult Health

## 2018-04-19 NOTE — Progress Notes (Signed)
Opened in error; Disregard.

## 2018-08-26 ENCOUNTER — Other Ambulatory Visit: Payer: Self-pay

## 2018-08-26 ENCOUNTER — Emergency Department (HOSPITAL_COMMUNITY)
Admission: EM | Admit: 2018-08-26 | Discharge: 2018-08-26 | Disposition: A | Discharge status: Home / Self Care | Payer: Medicare Other | Attending: Emergency Medicine | Admitting: Emergency Medicine

## 2018-08-26 DIAGNOSIS — I110 Hypertensive heart disease with heart failure: Secondary | ICD-10-CM | POA: Insufficient documentation

## 2018-08-26 DIAGNOSIS — J45909 Unspecified asthma, uncomplicated: Secondary | ICD-10-CM | POA: Diagnosis not present

## 2018-08-26 DIAGNOSIS — Z79899 Other long term (current) drug therapy: Secondary | ICD-10-CM | POA: Diagnosis not present

## 2018-08-26 DIAGNOSIS — R55 Syncope and collapse: Secondary | ICD-10-CM

## 2018-08-26 DIAGNOSIS — Z7982 Long term (current) use of aspirin: Secondary | ICD-10-CM | POA: Diagnosis not present

## 2018-08-26 DIAGNOSIS — B3324 Viral cardiomyopathy: Secondary | ICD-10-CM | POA: Insufficient documentation

## 2018-08-26 DIAGNOSIS — I5042 Chronic combined systolic (congestive) and diastolic (congestive) heart failure: Secondary | ICD-10-CM | POA: Insufficient documentation

## 2018-08-26 LAB — CBC WITH DIFFERENTIAL/PLATELET
Abs Immature Granulocytes: 0.02 10*3/uL (ref 0.00–0.07)
Basophils Absolute: 0 10*3/uL (ref 0.0–0.1)
Basophils Relative: 0 %
Eosinophils Absolute: 0 10*3/uL (ref 0.0–0.5)
Eosinophils Relative: 1 %
HCT: 36.1 % (ref 36.0–46.0)
Hemoglobin: 12.1 g/dL (ref 12.0–15.0)
Immature Granulocytes: 0 %
Lymphocytes Relative: 18 %
Lymphs Abs: 1.1 10*3/uL (ref 0.7–4.0)
MCH: 31.6 pg (ref 26.0–34.0)
MCHC: 33.5 g/dL (ref 30.0–36.0)
MCV: 94.3 fL (ref 80.0–100.0)
Monocytes Absolute: 0.4 10*3/uL (ref 0.1–1.0)
Monocytes Relative: 6 %
Neutro Abs: 4.6 10*3/uL (ref 1.7–7.7)
Neutrophils Relative %: 75 %
Platelets: 211 10*3/uL (ref 150–400)
RBC: 3.83 MIL/uL — ABNORMAL LOW (ref 3.87–5.11)
RDW: 12.3 % (ref 11.5–15.5)
WBC: 6.1 10*3/uL (ref 4.0–10.5)
nRBC: 0 % (ref 0.0–0.2)

## 2018-08-26 LAB — BASIC METABOLIC PANEL
Anion gap: 7 (ref 5–15)
BUN: 13 mg/dL (ref 8–23)
CO2: 27 mmol/L (ref 22–32)
Calcium: 9.4 mg/dL (ref 8.9–10.3)
Chloride: 107 mmol/L (ref 98–111)
Creatinine, Ser: 0.79 mg/dL (ref 0.44–1.00)
GFR calc Af Amer: >60 mL/min (ref >60)
GFR calc non Af Amer: >60 mL/min (ref >60)
Glucose, Bld: 105 mg/dL — ABNORMAL HIGH (ref 70–99)
Potassium: 3.8 mmol/L (ref 3.5–5.1)
Sodium: 141 mmol/L (ref 135–145)

## 2018-08-26 MED ORDER — SODIUM CHLORIDE 0.9 % IV BOLUS
1000.0000 mL | Freq: Once | INTRAVENOUS | Status: AC
  Administered 2018-08-26: 17:00:00 1000 mL via INTRAVENOUS

## 2018-08-26 NOTE — ED Notes (Signed)
Pt was able to drink 240 ml of ginger ale, and eat a stick of cheese.

## 2018-08-26 NOTE — ED Provider Notes (Addendum)
[facility] DEPARTMENT Provider Note   CSN: [id number] Arrival date & time: 08/26/18  1551    History   Chief Complaint Chief Complaint  Patient presents with  . Loss of Consciousness    HPI Marie Fisher is a 73 y.o. female.     HPI  73 year old female comes in a chief complaint of syncope.  Patient has history of CHF, SVT status post ablation.  Patient reports that she was at a funeral when she suddenly started feeling hot and clammy.  The Patient notes she had fainted.  She denies any associated chest pain, palpitations, shortness of breath, headaches, neck pain.  The moment patient is not having any symptoms.  She denies any recent episodes of vomiting, diarrhea.  She was started on Abilify about couple of weeks ago.  No other medication changes.  Pt has no hx of PE, DVT and denies any exogenous hormone (testosterone / estrogen) use, long distance travels or surgery in the past 6 weeks, active cancer, recent immobilization.  She has not taken the topical estrogen cream in several months now.  Past Medical History:  Diagnosis Date  . Allergy   . Anemia   . Arthritis   . Asthma   . Cardiomyopathy ([place])   . CHF (congestive heart failure) ([place])   . 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 Depression   . Fibrocystic breast 08/10/2014  . GERD (gastroesophageal reflux disease) 08/10/2014  . Headache   . Hyperlipidemia 08/10/2014  . Hypertension   . OAB (overactive bladder) 08/10/2014  . 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 Stroke ([place]) 2010   mini  . SVT (supraventricular tachycardia) ([place])   . Urinary tract infection    recurrent  . Vaginal atrophy 08/10/2014  . 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 Rectocele 08/17/2016  . Endocervical polyp 08/17/2016  . History of TIA (transient ischemic attack) 06/28/2015  . 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 Shortness of breath on exertion 06/28/2015  . Chest pain 06/17/2015  . Acquired hypothyroidism 12/24/2014  . Essential (primary) hypertension 10/12/2014  . Urinary frequency 09/05/2014  . Absolute anemia 08/27/2014  . BP (high blood pressure) 08/27/2014  . Hyperlipidemia 08/10/2014  . GERD (gastroesophageal reflux disease) 08/10/2014  . Fibrocystic breast 08/10/2014  . 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 Vaginal atrophy 08/10/2014  . 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 OAB (overactive bladder) 08/10/2014  . Urinary tract infection   . Mitral valve prolapse 12/26/2012  . Systolic dysfunction 35/36/1443  . 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 Supraventricular tachycardia ([place]) 10/07/2012    Past Surgical History:  Procedure Laterality Date  . ABLATION N/A    Cardiac  . BLADDER SURGERY    . BREAST BIOPSY Bilateral    x3  . CARPAL TUNNEL RELEASE     bil.  . CERVICAL SPINE SURGERY  1990  . ESOPHAGOGASTRODUODENOSCOPY (EGD) WITH PROPOFOL N/A 09/24/2015   Procedure: ESOPHAGOGASTRODUODENOSCOPY (EGD) WITH PROPOFOL;  Surgeon: Lollie Sails, MD;  Location: [facility] ENDOSCOPY;  Service: Endoscopy;  Laterality: N/A;  . NECK SURGERY     from front  . tension free vaginal tape       OB History    Gravida  3   Para  3   Term  3   Preterm  AB      Living  3     SAB      TAB      Ectopic      Multiple      Live Births  3            Home Medications    Prior to Admission medications   Medication Sig Start Date End Date Taking? Authorizing Provider  aspirin 81 MG tablet Take 81 mg by mouth daily.    [provider]  atorvastatin (LIPITOR) 20 MG tablet     [provider]  bisoprolol (ZEBETA) 5 MG tablet Take 5 mg by mouth daily. Reported on 09/23/2015 09/10/14   [provider]  buPROPion (WELLBUTRIN XL) 300 MG 24 hr tablet Take 300 mg  by mouth daily.    [provider]  Calcium Citrate 200 MG TABS Take 300 mg by mouth.    [provider]  candesartan (ATACAND) 32 MG tablet Take 32 mg by mouth daily.    [provider]  cholecalciferol (BIO-D-MULSION) 400 UNT/0.03ML LIQD Take 0.06 mL by mouth daily.    [provider]  Cranberry-Vitamin C (AZO CRANBERRY URINARY TRACT PO)     [provider]  DHEA 25 MG CAPS Take 5 mg by mouth daily.     [provider]  docusate sodium (COLACE) 100 MG capsule Take 100 mg by mouth 2 (two) times daily.    [provider]  estradiol (ESTRACE) 0.1 MG/GM vaginal cream Place 0.5 Applicatorfuls vaginally 2 (two) times a week. 10/04/17   Defrancesco, Alanda Slim, MD  Evening Primrose Oil 1000 MG CAPS     [provider]  Ipratropium-Albuterol (COMBIVENT) 20-100 MCG/ACT AERS respimat Inhale into the lungs. 03/28/16 03/28/17  [provider]  liothyronine (CYTOMEL) 25 MCG tablet Take 12.5 mcg by mouth 2 (two) times daily.  08/05/14   [provider]  Magnesium Citrate 100 MG TABS Take by mouth.    [provider]  pantoprazole (PROTONIX) 40 MG tablet Take by mouth. 07/19/15   [provider]  PHOSPHATIDYL CHOLINE PO Take 1 tablet by mouth daily.     [provider]  Probiotic Product (RA PROBIOTIC MAX STRENGTH PO) Take by mouth.    [provider]  Saw Palmetto 1000 MG CAPS Take by mouth.    [provider]  SUMAtriptan (IMITREX) 20 MG/ACT nasal spray Place 20 mg into the nose every 2 (two) hours as needed for migraine. 1 Inhaler every two (2) hours as needed. 12/12/06   [provider]  TRACE MINERALS CR-CU-MN-ZN IV Inject into the vein.    [provider]  UNABLE TO FIND 4 tablets daily. Med Name: Lipotropix    [provider]  UNABLE TO FIND 5,000 mcg 2 (two) times daily. Med Name: omega pure 61- DR V    [provider]  UNABLE TO FIND 2  tablets daily. Med Name: Osteosheath-DR V    [provider]  UNABLE TO FIND Med Name: bio-ae    [provider]  UNABLE TO FIND Med Name: biotagen    [provider]  UNABLE TO FIND Med Name: boro tab    [provider]  UNABLE TO FIND Med Name: gluterase    [provider]  UNABLE TO FIND Med Name: memorall    [provider]  UNABLE TO FIND Med Name: methyl protect    [provider]  valACYclovir (VALTREX) 500 MG  tablet Take 500 mg by mouth 2 (two) times daily.    [provider]  vitamin C (ASCORBIC ACID) 500 MG tablet Take 500 mg by mouth daily.    [provider]    Family History Family History  Problem Relation Age of Onset  . Diabetes Mellitus I Father   . Heart disease Father   . Heart disease Mother   . Diabetes Mellitus I Paternal Aunt   . Breast cancer Paternal Grandmother   . Breast cancer Maternal Grandmother   . Diabetes Mellitus I Maternal Grandfather   . Colon cancer Neg Hx   . Stomach cancer Neg Hx   . Ovarian cancer Neg Hx     Social History Social History   Tobacco Use  . Smoking status: Never Smoker  . Smokeless tobacco: Never Used  Substance Use Topics  . Alcohol use: Yes    Comment: rare  . Drug use: No     Allergies   Other and Wheat bran   Review of Systems Review of Systems  Constitutional: Positive for activity change.  Respiratory: Negative for shortness of breath.   Cardiovascular: Negative for chest pain and palpitations.  Gastrointestinal: Negative for vomiting.  Neurological: Negative for dizziness and headaches.  All other systems reviewed and are negative.    Physical Exam Updated Vital Signs BP 135/90 (BP Location: Right Arm)   Pulse 96   Temp 97.6 F (36.4 C) (Oral)   Resp 20   Ht 5\' 2"  (1.575 m)   Wt 58.1 kg   SpO2 99%   BMI 23.41 kg/m   Physical Exam Vitals signs and nursing note reviewed.  Constitutional:      Appearance: She  is well-developed.  HENT:     Head: Normocephalic and atraumatic.  Eyes:     Pupils: Pupils are equal, round, and reactive to light.  Neck:     Musculoskeletal: Neck supple.  Cardiovascular:     Rate and Rhythm: Normal rate and regular rhythm.     Heart sounds: Normal heart sounds. No murmur.  Pulmonary:     Effort: Pulmonary effort is normal. No respiratory distress.  Abdominal:     General: There is no distension.     Palpations: Abdomen is soft.     Tenderness: There is no abdominal tenderness. There is no guarding or rebound.  Musculoskeletal:     Right lower leg: No edema.     Left lower leg: No edema.  Skin:    General: Skin is warm and dry.  Neurological:     Mental Status: She is alert and oriented to person, place, and time.      ED Treatments / Results  Labs (all labs ordered are listed, but only abnormal results are displayed) Labs Reviewed  BASIC METABOLIC PANEL - Abnormal; Notable for the following components:      Result Value   Glucose, Bld 105 (*)    All other components within normal limits  CBC WITH DIFFERENTIAL/PLATELET - Abnormal; Notable for the following components:   RBC 3.83 (*)    All other components within normal limits    EKG EKG Interpretation  Date/Time:  Monday August 26 2018 16:00:07 EDT Ventricular Rate:  89 PR Interval:    QRS Duration: 104 QT Interval:  353 QTC Calculation: 430 R Axis:   -33 Text Interpretation:  Sinus rhythm Left axis deviation Borderline repolarization abnormality No acute changes t wave flattening No significant change since last tracing Confirmed by Kathrynn Humble, Sheridyn Canino (  [id number]) on 08/26/2018 4:31:02 PM   Radiology No results found.  Procedures Procedures (including critical care time)  Medications Ordered in ED Medications  sodium chloride 0.9 % bolus 1,000 mL (0 mLs Intravenous Stopped 08/26/18 1803)     Initial Impression / Assessment and Plan / ED Course  I have reviewed the triage vital signs and the  nursing notes.  Pertinent labs & imaging results that were available during my care of the patient were reviewed by me and considered in my medical decision making (see chart for details).        73 year old female comes in with chief complaint of syncope.  She was at a funeral when she suddenly started feeling hot, clammy and then she fainted.  He had no cardiac prodrome and no focal neurologic complaints.  She also denies any headache or neck pains.  She does have known history of CHF, but her last echocardiogram showed preserved EF and no significant wall motion abnormalities.  Additionally she has SVT history and has required ablation in the past.  DDx includes: Orthostatic hypotension Stroke Vertebral artery dissection/stenosis Dysrhythmia PE Vasovagal/neurocardiogenic syncope Aortic stenosis Valvular disorder/Cardiomyopathy Anemia  Based on the history, it seems like the patient's symptoms were likely vasovagal, as she was in a hot and crowded setting when the syncope occurred.  Utilizing the [place] CT score for syncope (minus the troponin), patient does not meet criteria for admission.  We will monitor in the ER for about 4 hours see if there is any cardiac dysrhythmia.  Initial EKG looks reassuring.  Final Clinical Impressions(s) / ED Diagnoses   Final diagnoses:  Syncope and collapse  Vasovagal episode    ED Discharge Orders    None       Varney Biles, MD 08/26/18 [place], Cleotis Sparr, MD 08/27/18 1531

## 2018-08-26 NOTE — ED Triage Notes (Signed)
Cam in via ems; reported syncope lasting for about 10 seconds. Reported pt was at a funeral and standing outside for about 10 mins. Stated hx of afib w/ ablation x 3; last ablation in 2007

## 2018-08-26 NOTE — ED Provider Notes (Signed)
Care assumed from Dr. Kathrynn Humble.  Please see his full H&P.  In short,  Marie Fisher is a 73 y.o. female with a PMH of CHF and SVT s/p ablation presents for syncope today during a funeral. Patient has been asymptomatic while in the ER.   Symptoms appear to be vasovagal in nature. Monitoring for cardiac arrthymias pending due to history of SVT and CHF. Patient will require monitoring for 4 hours. If no cardiac arrhthymias are present at 8pm, patient can be discharged home with close PCP follow up.   Review of EKGs are benign. No signs of cardiac arrhthymias. Patient continues to deny any symptoms. Patient is able to ambulate without difficulty. Suspect symptoms were due to a vasovagal response. Advised patient to follow up with PCP. Discussed return precautions. Patient states she understands and agrees with plan.    Arville Lime, [place] 08/26/18 2027    Varney Biles, MD 08/27/18 1746

## 2018-08-26 NOTE — Discharge Instructions (Addendum)
We saw you in the ER after you had a fainting spell. All the results in the ER are normal, labs and imaging. We suspect that you have fainted because of vasovagal event.  The workup in the ER is not complete, and is limited to screening for life threatening and emergent conditions only, so please see a primary care doctor for further evaluation.  Return to the ER immediately if you start having dizzy or near fainting spells, palpitations, chest pain, shortness of breath.

## 2018-10-09 ENCOUNTER — Encounter: Payer: Medicare Other | Admitting: Obstetrics and Gynecology

## 2018-12-17 ENCOUNTER — Other Ambulatory Visit: Payer: Self-pay | Admitting: Internal Medicine

## 2018-12-17 DIAGNOSIS — Z1231 Encounter for screening mammogram for malignant neoplasm of breast: Secondary | ICD-10-CM

## 2019-02-13 ENCOUNTER — Ambulatory Visit
Admission: RE | Admit: 2019-02-13 | Discharge: 2019-02-13 | Disposition: A | Discharge status: Home / Self Care | Payer: Medicare Other | Source: Ambulatory Visit | Attending: Internal Medicine | Admitting: Internal Medicine

## 2019-02-13 DIAGNOSIS — Z1231 Encounter for screening mammogram for malignant neoplasm of breast: Secondary | ICD-10-CM | POA: Diagnosis not present

## 2019-04-10 ENCOUNTER — Other Ambulatory Visit: Payer: Self-pay | Admitting: Neurology

## 2019-04-10 ENCOUNTER — Other Ambulatory Visit (HOSPITAL_COMMUNITY): Payer: Self-pay | Admitting: Neurology

## 2019-04-10 DIAGNOSIS — R4189 Other symptoms and signs involving cognitive functions and awareness: Secondary | ICD-10-CM

## 2019-04-13 ENCOUNTER — Ambulatory Visit (HOSPITAL_COMMUNITY): Payer: Medicare PPO

## 2019-04-21 ENCOUNTER — Ambulatory Visit: Payer: Medicare PPO | Attending: Internal Medicine

## 2019-04-21 DIAGNOSIS — Z23 Encounter for immunization: Secondary | ICD-10-CM | POA: Insufficient documentation

## 2019-04-21 NOTE — Progress Notes (Signed)
   Covid-19 Vaccination Clinic  Name:  Marie Fisher    MRN: [id number] DOB: December 01, 1945  04/21/2019  [name] was observed post Covid-19 immunization for 15 minutes without incidence. She was provided with Vaccine Information Sheet and instruction to access the V-Safe system.   [name] was instructed to call 911 with any severe reactions post vaccine: Marland Kitchen Difficulty breathing  . Swelling of your face and throat  . A fast heartbeat  . A bad rash all over your body  . Dizziness and weakness    Immunizations Administered    Name Date Dose VIS Date Route   Pfizer COVID-19 Vaccine 04/21/2019 11:12 AM 0.3 mL 03/07/2019 Intramuscular   Manufacturer: [place]   Lot: [id number]   [place]: [id number]

## 2019-04-23 ENCOUNTER — Ambulatory Visit (HOSPITAL_COMMUNITY): Admission: RE | Admit: 2019-04-23 | Payer: Medicare PPO | Source: Ambulatory Visit

## 2019-04-30 ENCOUNTER — Ambulatory Visit: Payer: Medicare PPO | Attending: Neurology | Admitting: Speech Pathology

## 2019-04-30 ENCOUNTER — Other Ambulatory Visit: Payer: Self-pay

## 2019-04-30 DIAGNOSIS — R41841 Cognitive communication deficit: Secondary | ICD-10-CM | POA: Diagnosis present

## 2019-05-01 ENCOUNTER — Encounter: Payer: Self-pay | Admitting: Speech Pathology

## 2019-05-01 NOTE — Therapy (Signed)
[place] MAIN [facility] SERVICES [address] [place], [place], [address] Phone: [phone]   Fax:  [phone]  Speech Language Pathology Evaluation  Patient Details  Name: Marie Fisher MRN: [id number] Date of Birth: 1945-04-25 Referring Provider (SLP): Ray Church, MD   Encounter Date: 04/30/2019  End of Session - 05/01/19 1223    Visit Number  1    Number of Visits  9    Date for SLP Re-Evaluation  06/25/19    Authorization Type  Medicare    Authorization Time Period  Start 04/30/2019    Authorization - Visit Number  1    Authorization - Number of Visits  10    SLP Start Time  0300    SLP Stop Time   0350    SLP Time Calculation (min)  50 min    Activity Tolerance  Patient tolerated treatment well       Past Medical History:  Diagnosis Date  . Allergy   . Anemia   . Arthritis   . Asthma   . Cardiomyopathy ([place])   . CHF (congestive heart failure) ([place])   . 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 Depression   . Fibrocystic breast 08/10/2014  . GERD (gastroesophageal reflux disease) 08/10/2014  . Headache   . Hyperlipidemia 08/10/2014  . Hypertension   . OAB (overactive bladder) 08/10/2014  . 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 Stroke ([place]) 2010   mini  . SVT (supraventricular tachycardia) ([place])   . Urinary tract infection    recurrent  . Vaginal atrophy 08/10/2014  . 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 ABLATION N/A    Cardiac  . BLADDER SURGERY    . BREAST BIOPSY Bilateral    x3  . CARPAL TUNNEL RELEASE     bil.  . CERVICAL SPINE SURGERY  1990  . ESOPHAGOGASTRODUODENOSCOPY (EGD) WITH PROPOFOL N/A 09/24/2015   Procedure: ESOPHAGOGASTRODUODENOSCOPY (EGD) WITH PROPOFOL;  Surgeon: Lollie Sails, MD;  Location[facility] ENDOSCOPY;  Service: Endoscopy;  Laterality: N/A;  . NECK SURGERY     from front  . tension free vaginal  tape      There were no vitals filed for this visit.   The MoCA was designed as a rapid screening tool for mild cognitive dysfunction. It assess different cognitive domains: attention and concentration, executive functions, memory, language, visuoconstructional skills, conceptual thinking, calculations and orientation.   Visuospatial/Executive: 4/5 Naming: 3/3 Attention:  Digit repetition: 2/2 List of letters: 1/1 Serial subtraction: 1/3 Language: Repetition: 1/2 Fluency: 0/1 Abstraction: 1/2 Delayed Recall: 3/5  MIS Score: 11/13 Total: 22/30 (Normal >/= 26/30)  Screener results indicate mild cognitive impairment. Patient completed all tasks quickly, without need for repetition of instruction. On many tasks (I.e. memory recall, subtraction) patient said "Oh boy" or "Oh, that's hard." Patient word-finding difficulties were not assessed during the testing period. Patient demonstrated excellent visuospatial skills but demonstrated difficulty with cube drawing. On counting, patient only corrected said 100-7, then started subtracting 7 from 80. ("93, 86, 77, 70, 63..."). Patient substituted "only" instead of "always" on sentence repetition task. Patient was only able to name 6 words that begin with F: "face, fiction, fancy, fast, forward, far". Patient classified train and bicycle as "things that move". Patient required maximal cues for 2/5 delayed recall items.  SLP Education - 05/01/19 1222    Education Details  The role of SLP in cognitive impairment    Person(s) Educated  Patient    Methods  Explanation    Comprehension  Verbalized understanding         SLP Long Term Goals - 05/01/19 1237      SLP LONG TERM GOAL #1   Title  Patient will demonstrate functional cognitive-communication skills for independent completion of personal responsibilities and leisure activities.    Time  8    Period  Weeks    Status  New    Target Date  06/25/19       SLP LONG TERM GOAL #2   Title  Patient will demonstrate oral/written comprehension for abstract/complex verbal/written information via grammatical, fluent, and cogent sentences to complete abstract/complex linguistic tasks with 80% accuracy.    Time  8    Period  Weeks    Status  New    Target Date  06/25/19      SLP LONG TERM GOAL #3   Title  Patient will complete high level word finding activities with 80% accuracy.    Time  8    Period  Weeks    Status  New    Target Date  06/25/19      SLP LONG TERM GOAL #4   Title  Patient will idependently utilize word-finding strategies in spontaneous speech tasks with 80% accuracy.    Time  8    Period  Weeks    Status  New    Target Date  06/25/19       Plan - 05/01/19 1226    Clinical Impression Statement  The patient is presenting with mild cognitive communication impairment as assessed with the [facility] Cognitive Assessment.  Patient was unclear as to why she needed to get evaluated by a speech therapist but did mention some word-finding difficulty. Patient answered all questions from the clinician cogently and concisely. She denied difficulty remembering appointments, managing finances, purchasing groceries, taking medication according to instructions, etc. On a spontaneous speech task, patient was able to cogently describe events in a logical and orderly fashion, paying attention to detail and making inferences about events not pictured. No word finding difficulty noted on task. Patient's performance on the MoCA indicated a mild cognitive impairment. Patient strengths per the screening results were naming, attention and orientation. Patient demonstrated difficulty with language tasks, abstraction and memory, particularly with delayed recall. Patient already uses several external aides for memory, such as writing down important dates in a calendar and making lists.   The patient will benefit form skilled speech therapy for restorative and  compensatory treatment for cognitive communication deficits.    Speech Therapy Frequency  1x /week    Duration  Other (comment)   8 weeks   Treatment/Interventions  Internal/external aids;Patient/family education;Language facilitation    Potential to Achieve Goals  Good    Potential Considerations  Ability to learn/carryover information;Family/community support;Co-morbidities;Previous level of function;Cooperation/participation level;Severity of impairments;Medical prognosis    Consulted and Agree with Plan of Care  Patient       Patient will benefit from skilled therapeutic intervention in order to improve the following deficits and impairments:   Cognitive communication deficit - Plan: SLP plan of care cert/re-cert    Problem List Patient Active Problem List   Diagnosis Date Noted  . Rectocele 08/17/2016  . Endocervical polyp 08/17/2016  . History of TIA (transient ischemic attack) 06/28/2015  . Ischemic heart disease  due to coronary artery obstruction ([place]) 06/28/2015  . Shortness of breath on exertion 06/28/2015  . Chest pain 06/17/2015  . Acquired hypothyroidism 12/24/2014  . Essential (primary) hypertension 10/12/2014  . Urinary frequency 09/05/2014  . Absolute anemia 08/27/2014  . BP (high blood pressure) 08/27/2014  . Hyperlipidemia 08/10/2014  . GERD (gastroesophageal reflux disease) 08/10/2014  . Fibrocystic breast 08/10/2014  . 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 Vaginal atrophy 08/10/2014  . 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 OAB (overactive bladder) 08/10/2014  . Urinary tract infection   . Mitral valve prolapse 12/26/2012  . Systolic dysfunction [id number]  . Chronic combined systolic and diastolic heart failure ([place]) 12/26/2012  . Supraventricular tachycardia ([place]) 10/07/2012    Lou Miner 05/01/2019, 2:00 PM  [place] MAIN [facility] SERVICES [address] [place], [place],  [address] Phone: [phone]   Fax:  [phone]  Name: Marie Fisher MRN: [id number] Date of Birth: [birth year]

## 2019-05-07 ENCOUNTER — Other Ambulatory Visit: Payer: Self-pay

## 2019-05-07 ENCOUNTER — Ambulatory Visit: Payer: Medicare PPO | Admitting: Speech Pathology

## 2019-05-07 DIAGNOSIS — R41841 Cognitive communication deficit: Secondary | ICD-10-CM

## 2019-05-08 ENCOUNTER — Encounter: Payer: Self-pay | Admitting: Speech Pathology

## 2019-05-08 NOTE — Therapy (Signed)
[place] MAIN [facility] SERVICES [address] [place], [place], [address] Phone: [phone]   Fax:  [phone]  Speech Language Pathology Treatment  Patient Details  Name: Marie Fisher MRN: [id number] Date of Birth: 10/10/1945 Referring Provider (SLP): Ray Church, MD   Encounter Date: 05/07/2019  End of Session - 05/08/19 1145    Visit Number  2    Number of Visits  9    Date for SLP Re-Evaluation  06/25/19    Authorization Type  Medicare    Authorization Time Period  Start 04/30/2019    Authorization - Visit Number  2    Authorization - Number of Visits  10    SLP Start Time  0300    SLP Stop Time   0350    SLP Time Calculation (min)  50 min    Activity Tolerance  Patient tolerated treatment well       Past Medical History:  Diagnosis Date  . Allergy   . Anemia   . Arthritis   . Asthma   . Cardiomyopathy ([place])   . CHF (congestive heart failure) ([place])   . 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 Depression   . Fibrocystic breast 08/10/2014  . GERD (gastroesophageal reflux disease) 08/10/2014  . Headache   . Hyperlipidemia 08/10/2014  . Hypertension   . OAB (overactive bladder) 08/10/2014  . 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 Stroke ([place]) 2010   mini  . SVT (supraventricular tachycardia) ([place])   . Urinary tract infection    recurrent  . Vaginal atrophy 08/10/2014  . 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 ABLATION N/A    Cardiac  . BLADDER SURGERY    . BREAST BIOPSY Bilateral    x3  . CARPAL TUNNEL RELEASE     bil.  . CERVICAL SPINE SURGERY  1990  . ESOPHAGOGASTRODUODENOSCOPY (EGD) WITH PROPOFOL N/A 09/24/2015   Procedure: ESOPHAGOGASTRODUODENOSCOPY (EGD) WITH PROPOFOL;  Surgeon: Lollie Sails, MD;  Location[facility] ENDOSCOPY;  Service: Endoscopy;  Laterality: N/A;  . NECK SURGERY     from front  . tension free vaginal  tape      There were no vitals filed for this visit.  Subjective Assessment - 05/08/19 1145    Subjective  "Do I need to be here?"    Currently in Pain?  No/denies            ADULT SLP TREATMENT - 05/08/19 0001      General Information   Behavior/Cognition  Alert;Cooperative;Pleasant mood    HPI  Marie Fisher is a 74 year old female presenting with a mild cognitive impairment and word-finding difficulty.      Treatment Provided   Treatment provided  Cognitive-Linquistic      Pain Assessment   Pain Assessment  No/denies pain      Cognitive-Linquistic Treatment   Treatment focused on  Cognition    Skilled Treatment  Patient was educated on word-finding/memory strategies. When given a concrete category task, patient was 96% accurate in determination of appropriate category given 3 items. When asked to find 1 item out of 4 that did not belong to the category, patient was 92% accurate. When prompted to complete analogies, patient was 84% accurate. Patient was quick to correct mistakes given minimal clinician cues. On the final task, patient was asked to deduct a word given three clues. Patient was 100% accurate  with a few prompts from the clinician to be more specific. Given a semantic feature analysis task, patient was able to develop 25 semantically related words and phrases for the prompt.     Assessment / Recommendations / Plan   Plan  Continue with current plan of care      Progression Toward Goals   Progression toward goals  Progressing toward goals       SLP Education - 05/08/19 1145    Education Details  word-finding strategies    Person(s) Educated  Patient    Methods  Explanation    Comprehension  Verbalized understanding         SLP Long Term Goals - 05/01/19 1237      SLP LONG TERM GOAL #1   Title  Patient will demonstrate functional cognitive-communication skills for independent completion of personal responsibilities and leisure activities.    Time  8     Period  Weeks    Status  New    Target Date  06/25/19      SLP LONG TERM GOAL #2   Title  Patient will demonstrate oral/written comprehension for abstract/complex verbal/written information via grammatical, fluent, and cogent sentences to complete abstract/complex linguistic tasks with 80% accuracy.    Time  8    Period  Weeks    Status  New    Target Date  06/25/19      SLP LONG TERM GOAL #3   Title  Patient will complete high level word finding activities with 80% accuracy.    Time  8    Period  Weeks    Status  New    Target Date  06/25/19      SLP LONG TERM GOAL #4   Title  Patient will idependently utilize word-finding strategies in spontaneous speech tasks with 80% accuracy.    Time  8    Period  Weeks    Status  New    Target Date  06/25/19       Plan - 05/08/19 1146    Clinical Impression Statement Patient completed all tasks quickly and with ease. Patient did not demonstrate any significant word-finding difficulty on any tasks. She stated that her word-finding difficulty is embarrassing when it happens, however, there was no elaboration on the topic. Patient may benefir from higher-level cognitive tasks.   Speech Therapy Frequency  1x /week    Duration  Other (comment)   8 weeks   Treatment/Interventions  Internal/external aids;Patient/family education;Language facilitation    Potential to Achieve Goals  Good    Potential Considerations  Ability to learn/carryover information;Family/community support;Co-morbidities;Previous level of function;Cooperation/participation level;Severity of impairments;Medical prognosis    Consulted and Agree with Plan of Care  Patient       Patient will benefit from skilled therapeutic intervention in order to improve the following deficits and impairments:   Cognitive communication deficit    Problem List Patient Active Problem List   Diagnosis Date Noted  . Rectocele 08/17/2016  . Endocervical polyp 08/17/2016  . History of TIA  (transient ischemic attack) 06/28/2015  . Ischemic heart disease due to coronary artery obstruction ([place]) 06/28/2015  . Shortness of breath on exertion 06/28/2015  . Chest pain 06/17/2015  . Acquired hypothyroidism 12/24/2014  . Essential (primary) hypertension 10/12/2014  . Urinary frequency 09/05/2014  . Absolute anemia 08/27/2014  . BP (high blood pressure) 08/27/2014  . Hyperlipidemia 08/10/2014  . GERD (gastroesophageal reflux disease) 08/10/2014  . Fibrocystic breast 08/10/2014  . Chronic Epstein  Barr virus (EBV) infection 08/10/2014  . Vaginal atrophy 08/10/2014  . 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 OAB (overactive bladder) 08/10/2014  . Urinary tract infection   . Mitral valve prolapse 12/26/2012  . Systolic dysfunction [id number]  . Chronic combined systolic and diastolic heart failure ([place]) 12/26/2012  . Supraventricular tachycardia ([place]) 10/07/2012    Arin Peral 05/08/2019, 11:48 AM  [place] MAIN [facility] SERVICES [address] [place], [place], [address] Phone: [phone]   Fax:  [phone]   Name: Marie Fisher MRN: [id number] Date of Birth: 07/09/1945

## 2019-05-09 ENCOUNTER — Ambulatory Visit: Payer: Medicare PPO | Attending: Internal Medicine

## 2019-05-09 DIAGNOSIS — Z23 Encounter for immunization: Secondary | ICD-10-CM | POA: Insufficient documentation

## 2019-05-09 NOTE — Progress Notes (Signed)
   Covid-19 Vaccination Clinic  Name:  Marie Fisher    MRN: [id number] DOB: 12/25/1945  05/09/2019  [name] was observed post Covid-19 immunization for 15 minutes without incidence. She was provided with Vaccine Information Sheet and instruction to access the V-Safe system.   [name] was instructed to call 911 with any severe reactions post vaccine: Marland Kitchen Difficulty breathing  . Swelling of your face and throat  . A fast heartbeat  . A bad rash all over your body  . Dizziness and weakness    Immunizations Administered    Name Date Dose VIS Date Route   Pfizer COVID-19 Vaccine 05/09/2019  1:55 PM 0.3 mL 03/07/2019 Intramuscular   Manufacturer: [place]   Lot: [id number]   [place]: [id number]

## 2019-05-14 ENCOUNTER — Other Ambulatory Visit: Payer: Self-pay

## 2019-05-14 ENCOUNTER — Ambulatory Visit: Payer: Medicare PPO | Admitting: Speech Pathology

## 2019-05-14 ENCOUNTER — Encounter: Payer: Self-pay | Admitting: Speech Pathology

## 2019-05-14 DIAGNOSIS — R41841 Cognitive communication deficit: Secondary | ICD-10-CM

## 2019-05-14 NOTE — Therapy (Signed)
[place] MAIN [facility] SERVICES [address] [place], [place], [address] Phone: [phone]   Fax:  [phone]  Speech Language Pathology Treatment  Patient Details  Name: Marie Fisher MRN: [id number] Date of Birth: 1946/01/09 Referring Provider (SLP): Ray Church, MD   Encounter Date: 05/14/2019  End of Session - 05/14/19 1623    Visit Number  3    Number of Visits  9    Date for SLP Re-Evaluation  06/25/19    Authorization Type  Medicare    Authorization Time Period  Start 04/30/2019    Authorization - Visit Number  3    Authorization - Number of Visits  10    SLP Start Time  0300    SLP Stop Time   0350    SLP Time Calculation (min)  50 min    Activity Tolerance  Patient tolerated treatment well       Past Medical History:  Diagnosis Date  . Allergy   . Anemia   . Arthritis   . Asthma   . Cardiomyopathy ([place])   . CHF (congestive heart failure) ([place])   . 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 Depression   . Fibrocystic breast 08/10/2014  . GERD (gastroesophageal reflux disease) 08/10/2014  . Headache   . Hyperlipidemia 08/10/2014  . Hypertension   . OAB (overactive bladder) 08/10/2014  . 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 Stroke ([place]) 2010   mini  . SVT (supraventricular tachycardia) ([place])   . Urinary tract infection    recurrent  . Vaginal atrophy 08/10/2014  . 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 ABLATION N/A    Cardiac  . BLADDER SURGERY    . BREAST BIOPSY Bilateral    x3  . CARPAL TUNNEL RELEASE     bil.  . CERVICAL SPINE SURGERY  1990  . ESOPHAGOGASTRODUODENOSCOPY (EGD) WITH PROPOFOL N/A 09/24/2015   Procedure: ESOPHAGOGASTRODUODENOSCOPY (EGD) WITH PROPOFOL;  Surgeon: Lollie Sails, MD;  Location: [facility] ENDOSCOPY;  Service: Endoscopy;  Laterality: N/A;  . NECK SURGERY     from front  . tension free vaginal  tape      There were no vitals filed for this visit.  Subjective Assessment - 05/14/19 1622    Subjective  Engaged but frustrated.    Currently in Pain?  No/denies            ADULT SLP TREATMENT - 05/14/19 0001      General Information   Behavior/Cognition  Alert;Cooperative;Pleasant mood    HPI  Marie Fisher is a 74 year old female presenting with a mild cognitive impairment and word-finding difficulty.      Treatment Provided   Treatment provided  Cognitive-Linquistic      Pain Assessment   Pain Assessment  No/denies pain      Cognitive-Linquistic Treatment   Treatment focused on  Cognition    Skilled Treatment  Patient was given complex synonym multiple choice tasks and asked to select the the correct synonym.Patient was 85% accurate on task. Given 5 complex analogies, patient was 40% accurate. Patient said she was guessing after the task was over. Given a word-fiding scavenger hunt, patient was able to find 6 words in 10 minutes given cues from the clinician. Although handed pen and paper, patient did not circle her answers or cross them out.   Given two similar photos, patient was able to  find 6 differences in 5 minutes, but remarked that it was "harder than it used to be". Given a visuospatial task, patient needed minimal cues to complete 2 tasks. She was 70% accurate independently.     Assessment / Recommendations / Plan   Plan  Continue with current plan of care      Progression Toward Goals   Progression toward goals  Progressing toward goals       SLP Education - 05/14/19 1622    Education Details  executive function skills    Person(s) Educated  Patient    Methods  Explanation    Comprehension  Verbalized understanding         SLP Long Term Goals - 05/01/19 1237      SLP LONG TERM GOAL #1   Title  Patient will demonstrate functional cognitive-communication skills for independent completion of personal responsibilities and leisure activities.    Time  8     Period  Weeks    Status  New    Target Date  06/25/19      SLP LONG TERM GOAL #2   Title  Patient will demonstrate oral/written comprehension for abstract/complex verbal/written information via grammatical, fluent, and cogent sentences to complete abstract/complex linguistic tasks with 80% accuracy.    Time  8    Period  Weeks    Status  New    Target Date  06/25/19      SLP LONG TERM GOAL #3   Title  Patient will complete high level word finding activities with 80% accuracy.    Time  8    Period  Weeks    Status  New    Target Date  06/25/19      SLP LONG TERM GOAL #4   Title  Patient will idependently utilize word-finding strategies in spontaneous speech tasks with 80% accuracy.    Time  8    Period  Weeks    Status  New    Target Date  06/25/19       Plan - 05/14/19 1623    Clinical Impression Statement Noted frustration across tasks despite good outcomes. Patient says she has word-finding trouble "weekly" and gave an example of forgetting a friends last name from along time ago. She states it is embarrassing to have to talk around a word she cannot remember. She does not use strategies and often moves on in the conversation. Patient did well independently on tasks AFTER receiving clinician cues. Noted poor problem solving skills. Patient has been given a pen and pencil each time she has come to a session, but she has declined to use it each time. Patient shared that she has done both word finding games/ finding the differences between pictures, but she commented that it feels more difficult than it used to.   Speech Therapy Frequency  1x /week    Duration  Other (comment)   8 weeks   Treatment/Interventions  Internal/external aids;Patient/family education;Language facilitation    Potential to Achieve Goals  Good    Potential Considerations  Ability to learn/carryover information;Family/community support;Co-morbidities;Previous level of function;Cooperation/participation  level;Severity of impairments;Medical prognosis    Consulted and Agree with Plan of Care  Patient       Patient will benefit from skilled therapeutic intervention in order to improve the following deficits and impairments:   Cognitive communication deficit    Problem List Patient Active Problem List   Diagnosis Date Noted  . Rectocele 08/17/2016  . Endocervical polyp 08/17/2016  .  History of TIA (transient ischemic attack) 06/28/2015  . Ischemic heart disease due to coronary artery obstruction ([place]) 06/28/2015  . Shortness of breath on exertion 06/28/2015  . Chest pain 06/17/2015  . Acquired hypothyroidism 12/24/2014  . Essential (primary) hypertension 10/12/2014  . Urinary frequency 09/05/2014  . Absolute anemia 08/27/2014  . BP (high blood pressure) 08/27/2014  . Hyperlipidemia 08/10/2014  . GERD (gastroesophageal reflux disease) 08/10/2014  . Fibrocystic breast 08/10/2014  . 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 Vaginal atrophy 08/10/2014  . 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 OAB (overactive bladder) 08/10/2014  . Urinary tract infection   . Mitral valve prolapse 12/26/2012  . Systolic dysfunction [id number]  . Chronic combined systolic and diastolic heart failure ([place]) 12/26/2012  . Supraventricular tachycardia ([place]) 10/07/2012    Dakwon Wenberg 05/14/2019, 4:24 PM  [place] MAIN [facility] SERVICES [address] [place], [place], [address] Phone: [phone]   Fax:  [phone]   Name: Marie Fisher MRN: [id number] Date of Birth: March 18, 1946

## 2019-05-21 ENCOUNTER — Encounter: Payer: Medicare Other | Admitting: Speech Pathology

## 2019-05-26 ENCOUNTER — Ambulatory Visit (HOSPITAL_COMMUNITY): Payer: Medicare PPO

## 2019-05-28 ENCOUNTER — Ambulatory Visit: Payer: Medicare PPO | Admitting: Speech Pathology

## 2019-05-31 ENCOUNTER — Ambulatory Visit (HOSPITAL_COMMUNITY): Payer: Medicare PPO

## 2019-06-04 ENCOUNTER — Ambulatory Visit: Payer: Medicare PPO | Admitting: Speech Pathology

## 2019-06-07 ENCOUNTER — Other Ambulatory Visit: Payer: Self-pay

## 2019-06-07 ENCOUNTER — Ambulatory Visit (HOSPITAL_COMMUNITY)
Admission: RE | Admit: 2019-06-07 | Discharge: 2019-06-07 | Disposition: A | Discharge status: Home / Self Care | Payer: Medicare PPO | Source: Ambulatory Visit | Attending: Neurology | Admitting: Neurology

## 2019-06-07 DIAGNOSIS — R4189 Other symptoms and signs involving cognitive functions and awareness: Secondary | ICD-10-CM

## 2019-06-11 ENCOUNTER — Encounter: Payer: Medicare Other | Admitting: Speech Pathology

## 2019-06-18 ENCOUNTER — Encounter: Payer: Medicare Other | Admitting: Speech Pathology

## 2019-06-25 ENCOUNTER — Other Ambulatory Visit: Payer: Self-pay

## 2019-06-25 ENCOUNTER — Ambulatory Visit: Payer: Medicare PPO | Attending: Neurology | Admitting: Speech Pathology

## 2019-06-25 ENCOUNTER — Encounter: Payer: Self-pay | Admitting: Speech Pathology

## 2019-06-25 DIAGNOSIS — R41841 Cognitive communication deficit: Secondary | ICD-10-CM | POA: Diagnosis not present

## 2019-06-25 NOTE — Therapy (Addendum)
[place] MAIN [facility] SERVICES [address] [place], [place], [address] Phone: [phone]   Fax:  [phone]  Speech Language Pathology Treatment/Discharge Summary  Patient Details  Name: Marie Fisher MRN: [id number] Date of Birth: 1945-12-08 Referring Provider (SLP): Marie Church, MD   Encounter Date: 06/25/2019  End of Session - 06/25/19 1611    Visit Number  4    Number of Visits  9    Date for SLP Re-Evaluation  06/25/19    Authorization Type  Medicare    Authorization Time Period  Start 04/30/2019    Authorization - Visit Number  4    Authorization - Number of Visits  10    SLP Start Time  1500    SLP Stop Time   1550    SLP Time Calculation (min)  50 min    Activity Tolerance  Patient tolerated treatment well       Past Medical History:  Diagnosis Date  . Allergy   . Anemia   . Arthritis   . Asthma   . Cardiomyopathy ([place])   . CHF (congestive heart failure) ([place])   . 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 Depression   . Fibrocystic breast 08/10/2014  . GERD (gastroesophageal reflux disease) 08/10/2014  . Headache   . Hyperlipidemia 08/10/2014  . Hypertension   . OAB (overactive bladder) 08/10/2014  . 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 Stroke ([place]) 2010   mini  . SVT (supraventricular tachycardia) ([place])   . Urinary tract infection    recurrent  . Vaginal atrophy 08/10/2014  . 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 ABLATION N/A    Cardiac  . BLADDER SURGERY    . BREAST BIOPSY Bilateral    x3  . CARPAL TUNNEL RELEASE     bil.  . CERVICAL SPINE SURGERY  1990  . ESOPHAGOGASTRODUODENOSCOPY (EGD) WITH PROPOFOL N/A 09/24/2015   Procedure: ESOPHAGOGASTRODUODENOSCOPY (EGD) WITH PROPOFOL;  Surgeon: Marie Sails, MD;  Location: [facility] ENDOSCOPY;  Service: Endoscopy;  Laterality: N/A;  . NECK SURGERY     from front  .  tension free vaginal tape      There were no vitals filed for this visit.  Subjective Assessment - 06/25/19 1610    Subjective  Engaged and in good spirits.            ADULT SLP TREATMENT - 06/25/19 0001      General Information   Behavior/Cognition  Alert;Cooperative;Pleasant mood    HPI  Marie Fisher is a 74 year old female presenting with a mild cognitive impairment and word-finding difficulty.      Treatment Provided   Treatment provided  Cognitive-Linquistic      Pain Assessment   Pain Assessment  No/denies pain      Cognitive-Linquistic Treatment   Treatment focused on  Cognition    Skilled Treatment  Patient was educated on the WRAP strategy and prompted to utilize it in a task to remember names. She utilized the strategy well and independently generated creative associations (alliterations) to remember names. She completed 3 visuospatial reasoning tasks completely independently and with 100% accuracy. She was noted to self-correct 4 times on the most complex puzzle. She then was asked to recall the ten names given a visual prompt and identify which strategy she used to remember. She remembered the names with 100% accuracy and identified helpful strategies for  all of them.      Assessment / Recommendations / Plan   Plan  Continue with current plan of care      Progression Toward Goals   Progression toward goals  Progressing toward goals       SLP Education - 06/25/19 1611    Education Details  executive function skills and memory strategies    Person(s) Educated  Patient    Methods  Explanation    Comprehension  Verbalized understanding         SLP Long Term Goals - 06/26/19 0942      SLP LONG TERM GOAL #1   Title  Patient will demonstrate functional cognitive-communication skills for independent completion of personal responsibilities and leisure activities.    Status  Achieved      SLP LONG TERM GOAL #2   Title  Patient will demonstrate oral/written  comprehension for abstract/complex verbal/written information via grammatical, fluent, and cogent sentences to complete abstract/complex linguistic tasks with 80% accuracy.    Status  Achieved      SLP LONG TERM GOAL #3   Title  Patient will complete high level word finding activities with 80% accuracy.    Status  Achieved      SLP LONG TERM GOAL #4   Title  Patient will idependently utilize word-finding strategies in spontaneous speech tasks with 80% accuracy.       Plan - 06/25/19 1612    Clinical Impression Statement  Patient demonstrates independent use of creative and salient memory strategies and does exceedingly well with remembering information utilizing these strategies. She denies that her cognitive communication deficit has any severe impact on her day-to-day life currently, although she does still complain of the word-finding difficulty. The patient affirms usage of word-finding strategies independently and has been noted to do so effectively during therapy. The patient has demonstrated excellent generalization of strategies and techniques from speech therapy and is no longer in need of speech therapy services. She was advised to come back if she feels that her symptoms become worse or start to negatively impact her daily functioning.    Speech Therapy Frequency  1x /week    Duration  Other (comment)   8 weeks   Treatment/Interventions  Internal/external aids;Patient/family education;Language facilitation    Potential to Achieve Goals  Good    Potential Considerations  Ability to learn/carryover information;Family/community support;Co-morbidities;Previous level of function;Cooperation/participation level;Severity of impairments;Medical prognosis    Consulted and Agree with Plan of Care  Patient       Patient will benefit from skilled therapeutic intervention in order to improve the following deficits and impairments:   Cognitive communication deficit    Problem List Patient  Active Problem List   Diagnosis Date Noted  . Rectocele 08/17/2016  . Endocervical polyp 08/17/2016  . History of TIA (transient ischemic attack) 06/28/2015  . 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 Shortness of breath on exertion 06/28/2015  . Chest pain 06/17/2015  . Acquired hypothyroidism 12/24/2014  . Essential (primary) hypertension 10/12/2014  . Urinary frequency 09/05/2014  . Absolute anemia 08/27/2014  . BP (high blood pressure) 08/27/2014  . Hyperlipidemia 08/10/2014  . GERD (gastroesophageal reflux disease) 08/10/2014  . Fibrocystic breast 08/10/2014  . 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 Vaginal atrophy 08/10/2014  . 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 OAB (overactive bladder) 08/10/2014  . Urinary tract infection   . Mitral valve prolapse 12/26/2012  . Systolic dysfunction  12/26/2012  . Chronic combined systolic and diastolic heart failure ([place]) 12/26/2012  . Supraventricular tachycardia ([place]) 10/07/2012    Marie Fisher 06/26/2019, 9:43 AM  [place] MAIN [facility] SERVICES [address] [place], [place], [address] Phone: [phone]   Fax:  [phone]   Name: Marie Fisher MRN: [id number] Date of Birth: 05/23/1945

## 2019-12-08 ENCOUNTER — Other Ambulatory Visit: Payer: Self-pay | Admitting: Internal Medicine

## 2019-12-08 DIAGNOSIS — Z1231 Encounter for screening mammogram for malignant neoplasm of breast: Secondary | ICD-10-CM

## 2019-12-10 ENCOUNTER — Other Ambulatory Visit: Payer: Self-pay | Admitting: Internal Medicine

## 2019-12-10 DIAGNOSIS — R053 Chronic cough: Secondary | ICD-10-CM

## 2019-12-17 ENCOUNTER — Ambulatory Visit: Payer: Medicare PPO

## 2019-12-26 ENCOUNTER — Ambulatory Visit
Admission: RE | Admit: 2019-12-26 | Discharge: 2019-12-26 | Disposition: A | Discharge status: Home / Self Care | Payer: Medicare PPO | Source: Ambulatory Visit | Attending: Internal Medicine | Admitting: Internal Medicine

## 2019-12-26 ENCOUNTER — Other Ambulatory Visit: Payer: Self-pay

## 2019-12-26 DIAGNOSIS — I700 Atherosclerosis of aorta: Secondary | ICD-10-CM | POA: Diagnosis not present

## 2019-12-26 DIAGNOSIS — R053 Chronic cough: Secondary | ICD-10-CM | POA: Insufficient documentation

## 2020-02-16 ENCOUNTER — Ambulatory Visit
Admission: RE | Admit: 2020-02-16 | Discharge: 2020-02-16 | Disposition: A | Discharge status: Home / Self Care | Payer: Medicare PPO | Source: Ambulatory Visit | Attending: Internal Medicine | Admitting: Internal Medicine

## 2020-02-16 ENCOUNTER — Other Ambulatory Visit: Payer: Self-pay

## 2020-02-16 DIAGNOSIS — Z1231 Encounter for screening mammogram for malignant neoplasm of breast: Secondary | ICD-10-CM | POA: Diagnosis present

## 2020-06-24 ENCOUNTER — Other Ambulatory Visit: Payer: Self-pay | Admitting: Internal Medicine

## 2020-06-24 DIAGNOSIS — R634 Abnormal weight loss: Secondary | ICD-10-CM

## 2020-07-12 ENCOUNTER — Other Ambulatory Visit: Payer: Self-pay

## 2020-07-12 ENCOUNTER — Ambulatory Visit
Admission: RE | Admit: 2020-07-12 | Discharge: 2020-07-12 | Disposition: A | Discharge status: Home / Self Care | Payer: Medicare PPO | Source: Ambulatory Visit | Attending: Internal Medicine | Admitting: Internal Medicine

## 2020-07-12 DIAGNOSIS — R634 Abnormal weight loss: Secondary | ICD-10-CM | POA: Diagnosis not present

## 2020-07-12 LAB — POCT I-STAT CREATININE: Creatinine, Ser: 0.7 mg/dL (ref 0.44–1.00)

## 2020-07-12 MED ORDER — IOHEXOL 300 MG/ML  SOLN
100.0000 mL | Freq: Once | INTRAMUSCULAR | Status: AC | PRN
  Administered 2020-07-12: 100 mL via INTRAVENOUS

## 2020-09-07 ENCOUNTER — Other Ambulatory Visit: Payer: Self-pay | Admitting: General Surgery

## 2020-09-08 LAB — SURGICAL PATHOLOGY

## 2021-01-27 ENCOUNTER — Other Ambulatory Visit: Payer: Self-pay | Admitting: Internal Medicine

## 2021-01-27 DIAGNOSIS — Z1231 Encounter for screening mammogram for malignant neoplasm of breast: Secondary | ICD-10-CM

## 2021-02-01 ENCOUNTER — Encounter: Payer: Self-pay | Admitting: Internal Medicine

## 2021-02-02 ENCOUNTER — Encounter
Admission: RE | Disposition: A | Discharge status: Home / Self Care | Payer: Self-pay | Source: Home / Self Care | Attending: Internal Medicine

## 2021-02-02 ENCOUNTER — Encounter: Payer: Self-pay | Admitting: Internal Medicine

## 2021-02-02 ENCOUNTER — Ambulatory Visit: Payer: Medicare PPO | Admitting: Anesthesiology

## 2021-02-02 ENCOUNTER — Ambulatory Visit
Admission: RE | Admit: 2021-02-02 | Discharge: 2021-02-02 | Disposition: A | Discharge status: Home / Self Care | Payer: Medicare PPO | Attending: Internal Medicine | Admitting: Internal Medicine

## 2021-02-02 DIAGNOSIS — J449 Chronic obstructive pulmonary disease, unspecified: Secondary | ICD-10-CM | POA: Insufficient documentation

## 2021-02-02 DIAGNOSIS — I509 Heart failure, unspecified: Secondary | ICD-10-CM | POA: Insufficient documentation

## 2021-02-02 DIAGNOSIS — I110 Hypertensive heart disease with heart failure: Secondary | ICD-10-CM | POA: Diagnosis not present

## 2021-02-02 DIAGNOSIS — N3281 Overactive bladder: Secondary | ICD-10-CM | POA: Diagnosis not present

## 2021-02-02 DIAGNOSIS — K640 First degree hemorrhoids: Secondary | ICD-10-CM | POA: Insufficient documentation

## 2021-02-02 DIAGNOSIS — R1030 Lower abdominal pain, unspecified: Secondary | ICD-10-CM | POA: Diagnosis present

## 2021-02-02 DIAGNOSIS — K30 Functional dyspepsia: Secondary | ICD-10-CM | POA: Insufficient documentation

## 2021-02-02 DIAGNOSIS — K5730 Diverticulosis of large intestine without perforation or abscess without bleeding: Secondary | ICD-10-CM | POA: Diagnosis not present

## 2021-02-02 HISTORY — DX: Narcolepsy with cataplexy: G47.411

## 2021-02-02 HISTORY — DX: Age-related osteoporosis without current pathological fracture: M81.0

## 2021-02-02 HISTORY — DX: Myalgic encephalomyelitis/chronic fatigue syndrome: G93.32

## 2021-02-02 HISTORY — DX: Chronic obstructive pulmonary disease, unspecified: J44.9

## 2021-02-02 HISTORY — PX: ESOPHAGOGASTRODUODENOSCOPY: SHX5428

## 2021-02-02 HISTORY — PX: COLONOSCOPY WITH PROPOFOL: SHX5780

## 2021-02-02 SURGERY — EGD (ESOPHAGOGASTRODUODENOSCOPY)
Anesthesia: General

## 2021-02-02 MED ORDER — PROPOFOL 10 MG/ML IV BOLUS
INTRAVENOUS | Status: DC | PRN
  Administered 2021-02-02: 30 mg via INTRAVENOUS
  Administered 2021-02-02: 60 mg via INTRAVENOUS

## 2021-02-02 MED ORDER — PROPOFOL 500 MG/50ML IV EMUL
INTRAVENOUS | Status: DC | PRN
  Administered 2021-02-02: 175 ug/kg/min via INTRAVENOUS

## 2021-02-02 MED ORDER — LIDOCAINE HCL (CARDIAC) PF 100 MG/5ML IV SOSY
PREFILLED_SYRINGE | INTRAVENOUS | Status: DC | PRN
  Administered 2021-02-02: 50 mg via INTRAVENOUS

## 2021-02-02 MED ORDER — LIDOCAINE HCL (PF) 1 % IJ SOLN
INTRAMUSCULAR | Status: AC
  Filled 2021-02-02: qty 2

## 2021-02-02 MED ORDER — SODIUM CHLORIDE 0.9 % IV SOLN
INTRAVENOUS | Status: DC
  Administered 2021-02-02: 1000 mL via INTRAVENOUS

## 2021-02-02 NOTE — Anesthesia Preprocedure Evaluation (Addendum)
Anesthesia Evaluation  Patient identified by MRN, date of birth, ID band Patient awake    Reviewed: Allergy & Precautions, NPO status , Patient's Chart, lab work & pertinent test results, reviewed documented beta blocker date and time   History of Anesthesia Complications Negative for: history of anesthetic complications  Airway Mallampati: II  TM Distance: >3 FB Neck ROM: Limited    Dental   Pulmonary asthma , COPD (prn inhalers),    Pulmonary exam normal        Cardiovascular hypertension, +CHF (hx of viral cardiomyopathy )  Normal cardiovascular exam+ dysrhythmias ( SVT s/p ablation x 3 - last one in 2007)      Neuro/Psych  Headaches, Depression   Narcolepsy and cataplexy  Cervical spine surgery x 2 Back surgery CVA (several mini strokes)    GI/Hepatic Neg liver ROS, GERD  ,  Endo/Other  Hypothyroidism   Osteoporosis  Renal/GU negative Renal ROS     Musculoskeletal  (+) Arthritis ,   Abdominal   Peds  Hematology  (+) anemia ,   Anesthesia Other Findings EKG 08/26/18 Sinus rhythm Left axis deviation Borderline repolarization abnormality No acute changes t wave flattening No significant change  Echo 2021 NORMAL LEFT VENTRICULAR SYSTOLIC FUNCTION  NORMAL RIGHT VENTRICULAR SYSTOLIC FUNCTION  Grade 1 diastolic dysfunction No significant valvular disease   Negative stress test in 2017  Reproductive/Obstetrics                            Anesthesia Physical Anesthesia Plan  ASA: 3  Anesthesia Plan: General   Post-op Pain Management:    Induction: Intravenous  PONV Risk Score and Plan:   Airway Management Planned: Natural Airway  Additional Equipment:   Intra-op Plan:   Post-operative Plan:   Informed Consent: I have reviewed the patients History and Physical, chart, labs and discussed the procedure including the risks, benefits and alternatives for the proposed  anesthesia with the patient or authorized representative who has indicated his/her understanding and acceptance.       Plan Discussed with: CRNA  Anesthesia Plan Comments:         Anesthesia Quick Evaluation

## 2021-02-02 NOTE — Interval H&P Note (Signed)
History and Physical Interval Note:  02/02/2021 11:05 AM  Marie Fisher  has presented today for surgery, with the diagnosis of ABDOMINAL PAIN,GERD.  The various methods of treatment have been discussed with the patient and family. After consideration of risks, benefits and other options for treatment, the patient has consented to  Procedure(s) with comments: ESOPHAGOGASTRODUODENOSCOPY (EGD) (N/A) - PM arrival COLONOSCOPY WITH PROPOFOL (N/A) as a surgical intervention.  The patient's history has been reviewed, patient examined, no change in status, stable for surgery.  I have reviewed the patient's chart and labs.  Questions were answered to the patient's satisfaction.     [place], [place]

## 2021-02-02 NOTE — Transfer of Care (Signed)
Immediate Anesthesia Transfer of Care Note  Patient: Marie Fisher  Procedure(s) Performed: ESOPHAGOGASTRODUODENOSCOPY (EGD) COLONOSCOPY WITH PROPOFOL  Patient Location: PACU  Anesthesia Type:General  Level of Consciousness: awake and drowsy  Airway & Oxygen Therapy: Patient Spontanous Breathing  Post-op Assessment: Report given to RN and Post -op Vital signs reviewed and stable  Post vital signs: Reviewed and stable  Last Vitals:  Vitals Value Taken Time  BP 113/67 02/02/21 1222  Temp    Pulse 67 02/02/21 1222  Resp 19 02/02/21 1222  SpO2 100 % 02/02/21 1222    Last Pain:  Vitals:   02/02/21 1222  TempSrc: Temporal  PainSc: Asleep         Complications: No notable events documented.

## 2021-02-02 NOTE — H&P (Signed)
Outpatient short stay form Pre-procedure 02/02/2021 11:01 AM Marie Bertram K. Marie Fisher, M.D.  Primary Physician: Fulton Reek, M.D.  Reason for visit:  Lower abdominal pain, colonic diverticulosis, GERD with esophagitis.   History of present illness:  75 y/o patient with mild lower abdominal cramping relieved with BM also has hx of diverticulosis. Has GERD largely controlled with medication and lifestyle changes    Current Facility-Administered Medications:    0.9 %  sodium chloride infusion, , Intravenous, Continuous, Gowri Suchan, Benay Pike, MD  Medications Prior to Admission  Medication Sig Dispense Refill Last Dose   amLODipine (NORVASC) 5 MG tablet Take 5 mg by mouth daily.   02/02/2021 at 0330   aspirin 81 MG tablet Take 81 mg by mouth daily.   02/01/2021   atorvastatin (LIPITOR) 20 MG tablet    02/01/2021   bisoprolol (ZEBETA) 5 MG tablet Take 5 mg by mouth daily. Reported on 09/23/2015   02/01/2021   buPROPion (WELLBUTRIN XL) 300 MG 24 hr tablet Take 300 mg by mouth daily.   02/01/2021   cholecalciferol (VITAMIN D3) 400 UNT/0.03ML LIQD Take 0.06 mL by mouth daily.   Past Week   Cranberry-Vitamin C (AZO CRANBERRY URINARY TRACT PO)    Past Week   DHEA 25 MG CAPS Take 5 mg by mouth daily.    Past Week   docusate sodium (COLACE) 100 MG capsule Take 100 mg by mouth 2 (two) times daily.   Past Week   estradiol (ESTRACE) 0.1 MG/GM vaginal cream Place 0.5 Applicatorfuls vaginally 2 (two) times a week. 42.5 g 6 02/01/2021   Evening Primrose Oil 1000 MG CAPS    Past Week   liothyronine (CYTOMEL) 25 MCG tablet Take 12.5 mcg by mouth 2 (two) times daily.    02/01/2021   Magnesium Citrate 100 MG TABS Take by mouth.   Past Week   pantoprazole (PROTONIX) 40 MG tablet Take by mouth.   02/01/2021   PHOSPHATIDYL CHOLINE PO Take 1 tablet by mouth daily.    02/01/2021   Probiotic Product (RA PROBIOTIC MAX STRENGTH PO) Take by mouth.   Past Week   Saw Palmetto 1000 MG CAPS Take by mouth.   Past Week   TRACE MINERALS  [id number] IV Inject into the vein.   Past Week   UNABLE TO FIND 4 tablets daily. Med Name: Lipotropix   Past Week   UNABLE TO FIND 5,000 mcg 2 (two) times daily. Med Name: omega pure 53- DR V   Past Week   UNABLE TO FIND 2 tablets daily. Med Name: Sharmaine Base   Past Week   UNABLE TO FIND Med Name: bio-ae   Past Week   UNABLE TO FIND Med Name: biotagen   Past Week   UNABLE TO FIND Med Name: boro tab   Past Week   UNABLE TO FIND Med Name: gluterase   Past Week   UNABLE TO FIND Med Name: memorall   Past Week   UNABLE TO FIND Med Name: methyl protect   Past Week   valACYclovir (VALTREX) 500 MG tablet Take 500 mg by mouth 2 (two) times daily.   02/01/2021   vitamin C (ASCORBIC ACID) 500 MG tablet Take 500 mg by mouth daily.   Past Week   Calcium Citrate 200 MG TABS Take 300 mg by mouth.      candesartan (ATACAND) 32 MG tablet Take 32 mg by mouth daily. (Patient not taking: Reported on 02/02/2021)   Not Taking   Ipratropium-Albuterol (COMBIVENT) 20-100 MCG/ACT AERS  respimat Inhale into the lungs.      SUMAtriptan (IMITREX) 20 MG/ACT nasal spray Place 20 mg into the nose every 2 (two) hours as needed for migraine. 1 Inhaler every two (2) hours as needed.    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    Physical Exam  Gen: Alert, oriented. Appears stated age.  HEENT: [place]/AT. PERRLA. Lungs: CTA, no wheezes. CV: RR nl S1, S2. Abd: soft, benign, no masses. BS+ Ext: No edema. Pulses 2+    Planned procedures: Proceed with EGD and colonoscopy. The patient understands the nature of the planned procedure, indications, risks, alternatives and potential complications including but not limited to bleeding, infection, perforation, damage to internal organs and possible oversedation/side effects from anesthesia. The patient agrees and gives consent to proceed.  Please refer to procedure notes for findings, recommendations and patient disposition/instructions.     [name], M.D. Gastroenterology 02/02/2021  11:01 AM

## 2021-02-02 NOTE — Anesthesia Postprocedure Evaluation (Signed)
Anesthesia Post Note  Patient: Marie Fisher  Procedure(s) Performed: ESOPHAGOGASTRODUODENOSCOPY (EGD) COLONOSCOPY WITH PROPOFOL  Patient location during evaluation: PACU Anesthesia Type: General Level of consciousness: awake and alert Pain management: pain level controlled Vital Signs Assessment: post-procedure vital signs reviewed and stable Respiratory status: spontaneous breathing, nonlabored ventilation, respiratory function stable and patient connected to nasal cannula oxygen Cardiovascular status: blood pressure returned to baseline and stable Postop Assessment: no apparent nausea or vomiting Anesthetic complications: no   No notable events documented.   Last Vitals:  Vitals:   02/02/21 1242 02/02/21 1252  BP: 120/77 130/77  Pulse: 82 81  Resp: 17 15  Temp:    SpO2: 100% 95%    Last Pain:  Vitals:   02/02/21 1252  TempSrc:   PainSc: 0-No pain                 [place]

## 2021-02-02 NOTE — Op Note (Signed)
[facility] Patient Name: Marie Fisher Procedure Date: 02/02/2021 10:47 AM MRN: [id number] Account #: [id number] Date of Birth: 06-11-1945 Admit Type: Outpatient Age: 75 Room: [facility] ENDO ROOM 2 Gender: Female Note Status: Finalized Instrument Name: Park Meo [id number] Procedure:             Colonoscopy Indications:           Lower abdominal pain, Follow-up of diverticula Providers:             Benay Pike. Alice Reichert MD, MD Referring MD:          Leonie Douglas. Doy Hutching, MD (Referring MD) Medicines:             Propofol per Anesthesia Complications:         No immediate complications. Procedure:             Pre-Anesthesia Assessment:                        - The risks and benefits of the procedure and the                         sedation options and risks were discussed with the                         patient. All questions were answered and informed                         consent was obtained.                        - Patient identification and proposed procedure were                         verified prior to the procedure by the nurse. The                         procedure was verified in the procedure room.                        - ASA Grade Assessment: III - A patient with severe                         systemic disease.                        - After reviewing the risks and benefits, the patient                         was deemed in satisfactory condition to undergo the                         procedure.                        After obtaining informed consent, the colonoscope was                         passed under direct vision. Throughout the procedure,  the patient's blood pressure, pulse, and oxygen                         saturations were monitored continuously. The                         Colonoscope was introduced through the anus and                         advanced to the the cecum, identified by appendiceal                          orifice and ileocecal valve. The colonoscopy was                         performed without difficulty. The patient tolerated                         the procedure well. The quality of the bowel                         preparation was good. The ileocecal valve, appendiceal                         orifice, and rectum were photographed. Findings:      The perianal and digital rectal examinations were normal. Pertinent       negatives include normal sphincter tone and no palpable rectal lesions.      Non-bleeding internal hemorrhoids were found during retroflexion. The       hemorrhoids were Grade I (internal hemorrhoids that do not prolapse).      Multiple small and large-mouthed diverticula were found in the sigmoid       colon. There was no evidence of diverticular bleeding.      The exam was otherwise without abnormality. Impression:            - Non-bleeding internal hemorrhoids.                        - Moderate diverticulosis in the sigmoid colon. There                         was no evidence of diverticular bleeding.                        - The examination was otherwise normal.                        - No specimens collected. Recommendation:        - Patient has a contact number available for                         emergencies. The signs and symptoms of potential                         delayed complications were discussed with the patient.                         Return to normal activities tomorrow. Written  discharge instructions were provided to the patient.                        - Resume previous diet.                        - Continue present medications.                        - No repeat colonoscopy due to current age (46 years                         or older) and the absence of colonic polyps.                        - Return to physician assistant in 3 months.                        - Follow up with Laurine Blazer, PA-C at [facility]. [phone].                        - The findings and recommendations were discussed with                         the patient. Procedure Code(s):     --- Professional ---                        [phone], Colonoscopy, flexible; diagnostic, including                         collection of specimen(s) by brushing or washing, when                         performed (separate procedure) Diagnosis Code(s):     --- Professional ---                        R10.30, Lower abdominal pain, unspecified                        K57.30, Diverticulosis of large intestine without                         perforation or abscess without bleeding                        K64.0, First degree hemorrhoids CPT copyright 2019 American Medical Association. All rights reserved. The codes documented in this report are preliminary and upon coder review may  be revised to meet current compliance requirements. Efrain Sella MD, MD 02/02/2021 12:19:54 PM This report has been signed electronically. Number of Addenda: 0 Note Initiated On: 02/02/2021 10:47 AM Scope Withdrawal Time: 0 hours 4 minutes 20 seconds  Total Procedure Duration: 0 hours 9 minutes 49 seconds  Estimated Blood Loss:  Estimated blood loss: none.      [facility]

## 2021-02-02 NOTE — Op Note (Signed)
[facility] Patient Name: Marie Fisher Procedure Date: 02/02/2021 11:58 AM MRN: [id number] Account #: [id number] Date of Birth: 1945/11/14 Admit Type: Outpatient Age: 75 Room: [facility] 2 ENDO ROOM 2 Gender: Female Note Status: Finalized Instrument Name: Upper Endoscope [id number] Procedure:             Upper GI endoscopy Indications:           Epigastric abdominal pain, Lower abdominal pain,                         Functional Dyspepsia, Suspected esophageal reflux Providers:             Benay Pike. Alice Reichert MD, MD Referring MD:          Leonie Douglas. Doy Hutching, MD (Referring MD) Medicines:             Propofol per Anesthesia Complications:         No immediate complications. Procedure:             Pre-Anesthesia Assessment:                        - The risks and benefits of the procedure and the                         sedation options and risks were discussed with the                         patient. All questions were answered and informed                         consent was obtained.                        - Patient identification and proposed procedure were                         verified prior to the procedure by the nurse. The                         procedure was verified in the procedure room.                        - ASA Grade Assessment: III - A patient with severe                         systemic disease.                        - After reviewing the risks and benefits, the patient                         was deemed in satisfactory condition to undergo the                         procedure.                        After obtaining informed consent, the endoscope was  passed under direct vision. Throughout the procedure,                         the patient's blood pressure, pulse, and oxygen                         saturations were monitored continuously. The Endoscope                         was introduced through the mouth, and  advanced to the                         third part of duodenum. The upper GI endoscopy was                         accomplished without difficulty. The patient tolerated                         the procedure well. Findings:      The esophagus was normal.      The stomach was normal.      The examined duodenum was normal. Impression:            - Normal esophagus.                        - Normal stomach.                        - Normal examined duodenum.                        - No specimens collected. Recommendation:        - Proceed with colonoscopy Procedure Code(s):     --- Professional ---                        [phone], Esophagogastroduodenoscopy, flexible,                         transoral; diagnostic, including collection of                         specimen(s) by brushing or washing, when performed                         (separate procedure) Diagnosis Code(s):     --- Professional ---                        K30, Functional dyspepsia                        R10.30, Lower abdominal pain, unspecified                        R10.13, Epigastric pain CPT copyright 2019 American Medical Association. All rights reserved. The codes documented in this report are preliminary and upon coder review may  be revised to meet current compliance requirements. Efrain Sella MD, MD 02/02/2021 12:06:04 PM This report has been signed electronically. Number of Addenda: 0 Note Initiated On: 02/02/2021 11:58 AM Estimated Blood Loss:  Estimated blood loss: none.  [facility]

## 2021-02-21 ENCOUNTER — Other Ambulatory Visit: Payer: Self-pay

## 2021-02-21 ENCOUNTER — Ambulatory Visit
Admission: RE | Admit: 2021-02-21 | Discharge: 2021-02-21 | Disposition: A | Discharge status: Home / Self Care | Payer: Medicare PPO | Source: Ambulatory Visit | Attending: Internal Medicine | Admitting: Internal Medicine

## 2021-02-21 DIAGNOSIS — Z1231 Encounter for screening mammogram for malignant neoplasm of breast: Secondary | ICD-10-CM | POA: Diagnosis not present

## 2021-12-02 ENCOUNTER — Other Ambulatory Visit: Payer: Self-pay | Admitting: Internal Medicine

## 2021-12-02 DIAGNOSIS — Z1231 Encounter for screening mammogram for malignant neoplasm of breast: Secondary | ICD-10-CM

## 2022-04-18 ENCOUNTER — Ambulatory Visit
Admission: RE | Admit: 2022-04-18 | Discharge: 2022-04-18 | Disposition: A | Discharge status: Home / Self Care | Payer: Medicare PPO | Source: Ambulatory Visit | Attending: Internal Medicine | Admitting: Internal Medicine

## 2022-04-18 DIAGNOSIS — Z1231 Encounter for screening mammogram for malignant neoplasm of breast: Secondary | ICD-10-CM | POA: Diagnosis not present

## 2022-07-13 ENCOUNTER — Other Ambulatory Visit: Payer: Self-pay | Admitting: Student

## 2022-07-13 DIAGNOSIS — R413 Other amnesia: Secondary | ICD-10-CM

## 2022-07-13 DIAGNOSIS — R2689 Other abnormalities of gait and mobility: Secondary | ICD-10-CM

## 2022-07-31 ENCOUNTER — Ambulatory Visit
Admission: RE | Admit: 2022-07-31 | Discharge: 2022-07-31 | Disposition: A | Discharge status: Home / Self Care | Payer: Medicare PPO | Source: Ambulatory Visit | Attending: Student | Admitting: Student

## 2022-07-31 DIAGNOSIS — R2689 Other abnormalities of gait and mobility: Secondary | ICD-10-CM

## 2022-07-31 DIAGNOSIS — R413 Other amnesia: Secondary | ICD-10-CM

## 2022-09-20 ENCOUNTER — Other Ambulatory Visit: Payer: Self-pay | Admitting: Internal Medicine

## 2022-09-20 DIAGNOSIS — R1030 Lower abdominal pain, unspecified: Secondary | ICD-10-CM

## 2022-09-25 ENCOUNTER — Ambulatory Visit
Admission: RE | Admit: 2022-09-25 | Discharge: 2022-09-25 | Disposition: A | Discharge status: Home / Self Care | Payer: Medicare PPO | Source: Ambulatory Visit | Attending: Internal Medicine | Admitting: Internal Medicine

## 2022-09-25 DIAGNOSIS — R1030 Lower abdominal pain, unspecified: Secondary | ICD-10-CM | POA: Diagnosis present

## 2022-09-25 MED ORDER — IOHEXOL 300 MG/ML  SOLN
80.0000 mL | Freq: Once | INTRAMUSCULAR | Status: AC | PRN
  Administered 2022-09-25: 80 mL via INTRAVENOUS

## 2022-11-16 ENCOUNTER — Ambulatory Visit: Payer: Medicare PPO

## 2022-11-20 ENCOUNTER — Ambulatory Visit: Payer: Medicare PPO | Admitting: Physical Therapy

## 2022-11-22 ENCOUNTER — Ambulatory Visit: Payer: Medicare PPO | Admitting: Physical Therapy

## 2022-11-23 ENCOUNTER — Ambulatory Visit: Payer: Medicare PPO

## 2022-12-01 NOTE — Therapy (Unsigned)
OUTPATIENT PHYSICAL THERAPY NEURO EVALUATION   Patient Name: Marie Fisher MRN: [id number] DOB:1945/10/24, 77 y.o., female Today's Date: 12/04/2022   PCP: Marguarite Arbour, MD  REFERRING PROVIDER:  Morene Crocker, MD       END OF SESSION:  PT End of Session - 12/04/22 1712     Visit Number 1    Number of Visits 16    Date for PT Re-Evaluation 01/29/23    Progress Note Due on Visit 10    PT Start Time 1620    PT Stop Time 1658    PT Time Calculation (min) 38 min             Past Medical History:  Diagnosis Date   Allergy    Anemia    Arthritis    Asthma    Cardiomyopathy (HCC)    CHF (congestive heart failure) (HCC)    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   Chronic fatigue syndrome    COPD (chronic obstructive pulmonary disease) (HCC)    Depression    Fibrocystic breast 08/10/2014   GERD (gastroesophageal reflux disease) 08/10/2014   Headache    Hyperlipidemia 08/10/2014   Hypertension    Narcolepsy and cataplexy    OAB (overactive bladder) 08/10/2014   Osteoporosis    Prolapse of female pelvic organs 08/10/2014   Stroke (HCC) 03/27/2008   mini   SVT (supraventricular tachycardia) (HCC)    Urinary tract infection    recurrent   Vaginal atrophy 08/10/2014   Viral cardiomyopathy (HCC)    Past Surgical History:  Procedure Laterality Date   ABLATION N/A    Cardiac   BACK SURGERY     BLADDER SURGERY     BREAST BIOPSY Bilateral    x3   CARPAL TUNNEL RELEASE     bil.   CERVICAL SPINE SURGERY  1990   COLONOSCOPY WITH PROPOFOL N/A 02/02/2021   Procedure: COLONOSCOPY WITH PROPOFOL;  Surgeon: Toledo, Boykin Nearing, MD;  Location: [facility] ENDOSCOPY;  Service: Gastroenterology;  Laterality: N/A;   ESOPHAGOGASTRODUODENOSCOPY N/A 02/02/2021   Procedure: ESOPHAGOGASTRODUODENOSCOPY (EGD);  Surgeon: Toledo, Boykin Nearing, MD;  Location: [facility] ENDOSCOPY;  Service: Gastroenterology;  Laterality: N/A;  PM arrival   ESOPHAGOGASTRODUODENOSCOPY (EGD)  WITH PROPOFOL N/A 09/24/2015   Procedure: ESOPHAGOGASTRODUODENOSCOPY (EGD) WITH PROPOFOL;  Surgeon: Christena Deem, MD;  Location: [facility] ENDOSCOPY;  Service: Endoscopy;  Laterality: N/A;   NECK SURGERY     from front   tension free vaginal tape     TONSILLECTOMY     Patient Active Problem List   Diagnosis Date Noted   Rectocele 08/17/2016   Endocervical polyp 08/17/2016   History of TIA (transient ischemic attack) 06/28/2015   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) 06/28/2015   Shortness of breath on exertion 06/28/2015   Chest pain 06/17/2015   Acquired hypothyroidism 12/24/2014   Essential (primary) hypertension 10/12/2014   Urinary frequency 09/05/2014   Absolute anemia 08/27/2014   BP (high blood pressure) 08/27/2014   Hyperlipidemia 08/10/2014   GERD (gastroesophageal reflux disease) 08/10/2014   Fibrocystic breast 08/10/2014   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  Rationale for Evaluation and Treatment: Rehabilitation  SUBJECTIVE:                                                                                                                                                                                             SUBJECTIVE STATEMENT: For the past 4 months pt has not been well. Pt has diverticulitis and IBS. Pt has been to many Mds and the the neurologist referred her here. The past few weeks she has been dizzy a lot. She reports her dizziness feels unsteady and  feels she could pass out at times. Pt sees no correlation with positional changes and her dizziness. Pt reports in the past 4 months it has progressively gotten worse. No changes in medication as of recently, added an antibiotic for UTI about 4 weeks ago but did not notice any changes.   Pt accompanied by: self  PERTINENT HISTORY: For the past 4 months pt has not been well. Pt has diverticulitis and IBS. Pt has been to many Mds and the the neurologist referred her here. The past few weeks she has been dizzy a lot. She reports her dizziness feels unsteady and feels she could pass out at times. Pt sees no correlation with positional changes and her dizziness. Pt reports in the past 4 months it has progressively gotten worse. No changes in medication as of recently, added an antibiotic for UTI about 4 weeks ago but did not notice any changes.  PAIN:  Are you having pain? No  PRECAUTIONS: None  RED FLAGS: None   WEIGHT BEARING RESTRICTIONS: No  FALLS: Has patient fallen in last 6 months? Yes. Number of falls 1 when she tripped over something. No falls related to the dizziness.   LIVING ENVIRONMENT: Lives with: lives alone Lives in: House/apartment Stairs: Yes: Internal: 13 steps; but doesn't use them regularly Has following equipment at home: None  PLOF: Independent  PATIENT GOALS: Improve balance and be able to walk better.   OBJECTIVE:   DIAGNOSTIC FINDINGS: No evidence of acute intracranial abnormality or reversible cause of memory loss.  COGNITION: Overall cognitive status: Within functional limits for tasks assessed   SENSATION: Not tested  COORDINATION: ***    POSTURE: No Significant postural limitations  LOWER EXTREMITY ROM:     MMT  Right Eval Left Eval  Hip flexion 4 4  Hip extension    Hip abduction 4 4  Hip adduction 5 5  Hip internal rotation    Hip external rotation    Knee flexion  4+ 4+  Knee extension 4+ 4+  Ankle dorsiflexion 5 5  Ankle  plantarflexion 5 5  Ankle inversion    Ankle eversion     (Blank rows = not tested)  LOWER EXTREMITY MMT:    MMT Right Eval Left Eval  Hip flexion    Hip extension    Hip abduction    Hip adduction    Hip internal rotation    Hip external rotation    Knee flexion    Knee extension    Ankle dorsiflexion    Ankle plantarflexion    Ankle inversion    Ankle eversion    (Blank rows = not tested)  BED MOBILITY:  No problems   TRANSFERS:no problem with STS transfers, difficulty getting up form a chair in the past few weeks.   Floor:  test at future date   RAMP:  Level of Assistance: Complete Independence Assistive device utilized: None Ramp Comments: pt reports no issues with ramps   CURB:  Level of Assistance:  pt has near LOB when descending steps for curb simulation without UE assist.  Assistive device utilized: None Curb Comments: min A to prevent LOB  STAIRS: Level of Assistance: Min A Stair Negotiation Technique: Alternating Pattern  with No Rails Number of Stairs: 4  Height of Stairs: 6 in  Comments: ascending no LOB, descending required min A and UE assist to prevent LOB, did not clear step at bottom properly.    FUNCTIONAL TESTS:  Five times Sit to Stand Test (FTSS)  TIME: 8 sec  Cut off scores indicative of increased fall risk: >12 sec CVA, >16 sec PD, >13 sec vestibular (ANPTA Core Set of Outcome Measures for Adults with Neurologic Conditions, 2018)  Pt scores 22 / 28 on mini BEST balance test. Scores < 16 indicate increased risk for falls Para March, Aguada, & [place]) and MCID is 4 (Godi,et al, 2013)    [facility] PT Assessment - 12/04/22 0001       Standardized Balance Assessment   Standardized Balance Assessment Mini-BESTest      Mini-BESTest   Sit To Stand Normal: Comes to stand without use of hands and stabilizes independently.    Rise to Toes Normal: Stable for 3 s with maximum height.    Stand on one leg (left) Moderate: < 20 s    Stand on one  leg (right) Moderate: < 20 s   3 sec on R, 15 on L   Stand on one leg - lowest score 1    Compensatory Stepping Correction - Forward Normal: Recovers independently with a single, large step (second realignement is allowed).    Compensatory Stepping Correction - Backward Moderate: More than one step is required to recover equilibrium    Compensatory Stepping Correction - Left Lateral Moderate: Several steps to recover equilibrium    Compensatory Stepping Correction - Right Lateral Moderate: Several steps to recover equilibrium    Stepping Corredtion Lateral - lowest score 1    Stance - Feet together, eyes open, firm surface  Normal: 30s    Stance - Feet together, eyes closed, foam surface  Normal: 30s    Incline - Eyes Closed Normal: Stands independently 30s and aligns with gravity    Change in Gait Speed Normal: Significantly changes walkling speed without imbalance    Walk with head turns - Horizontal Moderate: performs head turns with reduction in gait speed.    Walk with pivot turns Normal: Turns with feet close FAST (< 3 steps) with  good balance.    Step over obstacles Moderate: Steps over box but touches box OR displays cautious behavior by slowing gait.    Timed UP & GO with Dual Task Moderate: Dual Task affects either counting OR walking (>10%) when compared to the TUG without Dual Task.   9.76 normal, 12 sec   Mini-BEST total score 22             8.3 sec 10 MWT  PATIENT SURVEYS:  FOTO 57  TODAY'S TREATMENT:                                                                                                                              DATE: Eval Only     PATIENT EDUCATION: Education details: POC Person educated: Patient Education method: Explanation Education comprehension: verbalized understanding  HOME EXERCISE PROGRAM: Establish visit 2 include single leg balance, dual task balance, and hip abduction and hip flexor strength.   GOALS: Goals reviewed with patient?  Yes  SHORT TERM GOALS: Target date: 01/01/2023       Patient will be independent in home exercise program to improve strength/mobility for better functional independence with ADLs. Baseline: No HEP currently  Goal status: INITIAL   LONG TERM GOALS: Target date: 01/29/2023   1.  Patient will increase FOTO score to equal to or greater than  64   to demonstrate statistically significant improvement in mobility and quality of life.  Baseline: 57 Goal status: INITIAL   2.  Patient will increase Mini BEST Balance score by > 4 points to demonstrate decreased fall risk during functional activities. Baseline: 22 Goal status: INITIAL   3.   Patient will reduce timed up and go with dual task to <11 seconds to reduce fall risk and demonstrate improved transfer/gait ability. Baseline: 13 sec Goal status: INITIAL  ***.  *** Baseline: *** Goal status: {GOALSTATUS:25110}     ASSESSMENT:  CLINICAL IMPRESSION: Patient is a 77 y.o. F who was seen today for physical therapy evaluation and treatment for balance impairments that have worsened over the past several months. .   OBJECTIVE IMPAIRMENTS: {opptimpairments:25111}.   ACTIVITY LIMITATIONS: {activitylimitations:27494}  PARTICIPATION LIMITATIONS: {participationrestrictions:25113}  PERSONAL FACTORS: {Personal factors:25162} are also affecting patient's functional outcome.   REHAB POTENTIAL: {rehabpotential:25112}  CLINICAL DECISION MAKING: {clinical decision making:25114}  EVALUATION COMPLEXITY: {Evaluation complexity:25115}  PLAN:  PT FREQUENCY: {rehab frequency:25116}  PT DURATION: {rehab duration:25117}  PLANNED INTERVENTIONS: {rehab planned interventions:25118::"Therapeutic exercises","Therapeutic activity","Neuromuscular re-education","Balance training","Gait training","Patient/Family education","Self Care","Joint mobilization"}  PLAN FOR NEXT SESSION: ***   Norman Herrlich, PT 12/04/2022, 5:13 PM

## 2022-12-04 ENCOUNTER — Ambulatory Visit: Payer: Medicare PPO | Attending: Neurology | Admitting: Physical Therapy

## 2022-12-04 DIAGNOSIS — M6281 Muscle weakness (generalized): Secondary | ICD-10-CM

## 2022-12-04 DIAGNOSIS — R2689 Other abnormalities of gait and mobility: Secondary | ICD-10-CM

## 2022-12-04 DIAGNOSIS — R269 Unspecified abnormalities of gait and mobility: Secondary | ICD-10-CM

## 2022-12-06 ENCOUNTER — Ambulatory Visit: Payer: Medicare PPO

## 2022-12-06 DIAGNOSIS — M6281 Muscle weakness (generalized): Secondary | ICD-10-CM | POA: Diagnosis not present

## 2022-12-06 DIAGNOSIS — R269 Unspecified abnormalities of gait and mobility: Secondary | ICD-10-CM

## 2022-12-06 DIAGNOSIS — R2689 Other abnormalities of gait and mobility: Secondary | ICD-10-CM

## 2022-12-06 NOTE — Therapy (Signed)
OUTPATIENT PHYSICAL THERAPY NEURO EVALUATION   Patient Name: Marie Fisher MRN: [id number] DOB:1945-05-12, 77 y.o., female Today's Date: 12/07/2022   PCP: Marguarite Arbour, MD  REFERRING PROVIDER:  Morene Crocker, MD       END OF SESSION:  PT End of Session - 12/06/22 1413     Visit Number 2    Number of Visits 16    Date for PT Re-Evaluation 01/29/23    Progress Note Due on Visit 10    PT Start Time 1405    PT Stop Time 1445    PT Time Calculation (min) 40 min    Equipment Utilized During Treatment Gait belt    Activity Tolerance Patient tolerated treatment well             Past Medical History:  Diagnosis Date   Allergy    Anemia    Arthritis    Asthma    Cardiomyopathy (HCC)    CHF (congestive heart failure) (HCC)    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   Chronic fatigue syndrome    COPD (chronic obstructive pulmonary disease) (HCC)    Depression    Fibrocystic breast 08/10/2014   GERD (gastroesophageal reflux disease) 08/10/2014   Headache    Hyperlipidemia 08/10/2014   Hypertension    Narcolepsy and cataplexy    OAB (overactive bladder) 08/10/2014   Osteoporosis    Prolapse of female pelvic organs 08/10/2014   Stroke (HCC) 03/27/2008   mini   SVT (supraventricular tachycardia)    Urinary tract infection    recurrent   Vaginal atrophy 08/10/2014   Viral cardiomyopathy (HCC)    Past Surgical History:  Procedure Laterality Date   ABLATION N/A    Cardiac   BACK SURGERY     BLADDER SURGERY     BREAST BIOPSY Bilateral    x3   CARPAL TUNNEL RELEASE     bil.   CERVICAL SPINE SURGERY  1990   COLONOSCOPY WITH PROPOFOL N/A 02/02/2021   Procedure: COLONOSCOPY WITH PROPOFOL;  Surgeon: Toledo, Boykin Nearing, MD;  Location: [facility] ENDOSCOPY;  Service: Gastroenterology;  Laterality: N/A;   ESOPHAGOGASTRODUODENOSCOPY N/A 02/02/2021   Procedure: ESOPHAGOGASTRODUODENOSCOPY (EGD);  Surgeon: Toledo, Boykin Nearing, MD;  Location: [facility]  ENDOSCOPY;  Service: Gastroenterology;  Laterality: N/A;  PM arrival   ESOPHAGOGASTRODUODENOSCOPY (EGD) WITH PROPOFOL N/A 09/24/2015   Procedure: ESOPHAGOGASTRODUODENOSCOPY (EGD) WITH PROPOFOL;  Surgeon: Christena Deem, MD;  Location: [facility] ENDOSCOPY;  Service: Endoscopy;  Laterality: N/A;   NECK SURGERY     from front   tension free vaginal tape     TONSILLECTOMY     Patient Active Problem List   Diagnosis Date Noted   Rectocele 08/17/2016   Endocervical polyp 08/17/2016   History of TIA (transient ischemic attack) 06/28/2015   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) 06/28/2015   Shortness of breath on exertion 06/28/2015   Chest pain 06/17/2015   Acquired hypothyroidism 12/24/2014   Essential (primary) hypertension 10/12/2014   Urinary frequency 09/05/2014   Absolute anemia 08/27/2014   BP (high blood pressure) 08/27/2014   Hyperlipidemia 08/10/2014   GERD (gastroesophageal reflux disease) 08/10/2014   Fibrocystic breast 08/10/2014   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   Vaginal atrophy 08/10/2014   Prolapse of female pelvic organs 08/10/2014   OAB (overactive bladder) 08/10/2014   Urinary tract infection    Mitral valve prolapse 12/26/2012   Systolic dysfunction 12/26/2012   Chronic combined systolic and diastolic  heart failure (HCC) 12/26/2012   Supraventricular tachycardia (HCC) 10/07/2012    ONSET DATE: R26.89 (ICD-10-CM) - Imbalance   REFERRING DIAG: R26.89 (ICD-10-CM) - Imbalance   THERAPY DIAG:  Muscle weakness (generalized)  Other abnormalities of gait and mobility  Abnormality of gait and mobility  Rationale for Evaluation and Treatment: Rehabilitation  SUBJECTIVE:                                                                                                                                                                                             SUBJECTIVE STATEMENT: Patient reports doing okay- had to park  far away. Reports ongoing unsteadiness and some dizziness  Pt accompanied by: self  PERTINENT HISTORY: For the past 4 months pt has not been well. Pt has diverticulitis and IBS. Pt has been to many MDs and the the neurologist referred her here for her balance. The past few weeks she has been dizzy a lot. She reports her dizziness feels unsteady and feels she could pass out at times. Pt sees no correlation with positional changes and her dizziness. Pt reports in the past 4 months it has progressively gotten worse. No changes in medication as of recently, added an antibiotic for UTI about 4 weeks ago but did not notice any changes in balance with this.  Patient has no falls or loss of balance as related to her reported dizziness just periods of unsteadiness where she feels like she could fall.  Patient has history of arthritis, cardiomyopathy, congestive heart failure, chronic fatigue syndrome, COPD, hyperlipidemia, hypertension, osteoporosis, TIA/mini strokes, and narcolepsy.  PAIN:  Are you having pain? No  PRECAUTIONS: None  RED FLAGS: None   WEIGHT BEARING RESTRICTIONS: No  FALLS: Has patient fallen in last 6 months? Yes. Number of falls 1 when she tripped over something. No falls related to the dizziness.   LIVING ENVIRONMENT: Lives with: lives alone Lives in: House/apartment Stairs: Yes: Internal: 13 steps; but doesn't use them regularly Has following equipment at home: None  PLOF: Independent  PATIENT GOALS: Improve balance and be able to walk better.   OBJECTIVE:   DIAGNOSTIC FINDINGS: No evidence of acute intracranial abnormality or reversible cause of memory loss.  COGNITION: Overall cognitive status: Within functional limits for tasks assessed   SENSATION: Not tested     POSTURE: No Significant postural limitations  LOWER EXTREMITY MMT:     MMT  Right Eval Left Eval  Hip flexion 4 4  Hip extension    Hip abduction 4 4  Hip adduction 5 5  Hip internal  rotation    Hip external rotation  Knee flexion 4+ 4+  Knee extension 4+ 4+  Ankle dorsiflexion 5 5  Ankle plantarflexion 5 5  Ankle inversion    Ankle eversion     (Blank rows = not tested)  BED MOBILITY:  No problems   TRANSFERS:no problem with STS transfers, difficulty getting up form a chair in the past few weeks.   Floor:  test at future date   RAMP:  Level of Assistance: Complete Independence Assistive device utilized: None Ramp Comments: pt reports no issues with ramps   CURB:  Level of Assistance:  pt has near LOB when descending steps for curb simulation without UE assist.  Assistive device utilized: None Curb Comments: min A to prevent LOB  STAIRS: Level of Assistance: Min A Stair Negotiation Technique: Alternating Pattern  with No Rails Number of Stairs: 4  Height of Stairs: 6 in  Comments: ascending no LOB, descending required min A and UE assist to prevent LOB, did not clear step at bottom properly.    FUNCTIONAL TESTS:  Five times Sit to Stand Test (FTSS)  TIME: 8 sec  Cut off scores indicative of increased fall risk: >12 sec CVA, >16 sec PD, >13 sec vestibular (ANPTA Core Set of Outcome Measures for Adults with Neurologic Conditions, 2018)  Pt scores 22 / 28 on mini BEST balance test. Scores < 16 indicate increased risk for falls Para [place], [place], & [place]) and MCID is 4 (Godi,et al, 2013)      8.3 sec 10 MWT  PATIENT SURVEYS:  FOTO 57  TODAY'S TREATMENT:                                                                                                                              DATE: 12/06/2022     NMR:  Dynamic high knee march on airex pad x 20 reps each LE VOR- Standing gaze stabilization with horizontal head turns x 10 reps VOR- Standing smooth pursuit x 10 x 2 trials Tandem stance - multiple attempts holding as long as possible up to 30 sec each leg- mostly ankle righting reaction- some unsteadiness.  SLS on firm surface- attempting to  hold as long as possible up to 10-20 sec (improved with practice)  SLS on airex pad - much more unsteady initially yet improved with attempts.   PATIENT EDUCATION: Education details: POC Person educated: Patient Education method: Explanation Education comprehension: verbalized understanding  HOME EXERCISE PROGRAM:  Access Code: [email or web address] URL: [email or web address] Date: 12/06/2022 Prepared by: Maureen Ralphs  Exercises - Seated Gaze Stabilization with Head Rotation  - 3 x weekly - 3 sets - 10 reps - Seated Horizontal Smooth Pursuit  - 3 x weekly - 3 sets - 10 reps - Tandem Stance  - 3 x weekly - 3 sets - 10 reps - Single Leg Stance  - 3 x weekly - 3-4 sets - 10-20 hold   GOALS: Goals reviewed with patient? Yes  SHORT TERM GOALS: Target date: 01/01/2023  Patient will be independent in home exercise program to improve strength/mobility for better functional independence with ADLs. Baseline: No HEP currently  Goal status: INITIAL   LONG TERM GOALS: Target date: 01/29/2023   1.  Patient will increase FOTO score to equal to or greater than  64   to demonstrate statistically significant improvement in mobility and quality of life.  Baseline: 57 Goal status: INITIAL   2.  Patient will increase Mini BEST Balance score by > 4 points to demonstrate decreased fall risk during functional activities. Baseline: 22 Goal status: INITIAL   3.  Patient will reduce timed up and go with dual task to <11 seconds to reduce fall risk and demonstrate improved transfer/gait ability. Baseline: 13 sec Goal status: INITIAL  4.  Patient will improve bilateral lower extremity single-leg stance to 20 seconds or greater in order to demonstrate improvement in balance and stability. Baseline: 3 seconds on right lower extremity 15 seconds on left lower extremity Goal status: INITIAL  5.  Patient will a send and descend 4 stairs without upper extremity assist in order to  improve her safety with stair navigation within the community Baseline: Patient has loss of balance when descending stairs without upper extremity assist initial eval requiring min assist from therapist to prevent fall Goal status: INITIAL'     ASSESSMENT:  CLINICAL IMPRESSION: Patient presents with good motivation for her 2nd visit. Treatment continued per initial evaluation plan to focus on dynamic balance. Instructed patient in some basic vestibular and balance activities. She verbalized good understanding of initial HEP and performed well with standing balance- adapting to tasks and demonstrating improvement with balance with training. She will need review/progression next visit and initiation of Hip strengthening next visit.   She will benefit from skilled physical therapy in order to improve her balance, improve her mobility, decrease her risk of falls, and improve her overall quality of life.  OBJECTIVE IMPAIRMENTS: decreased activity tolerance, decreased balance, decreased mobility, and decreased strength.   ACTIVITY LIMITATIONS: standing, squatting, and stairs  PARTICIPATION LIMITATIONS: laundry, community activity, and yard work  PERSONAL FACTORS: Age and 3+ comorbidities: arthritis, cardiomyopathy, congestive heart failure, chronic fatigue syndrome, COPD, hyperlipidemia, hypertension, osteoporosis, TIA/mini strokes, and narcolepsy.  are also affecting patient's functional outcome.   REHAB POTENTIAL: Good  CLINICAL DECISION MAKING: Evolving/moderate complexity  EVALUATION COMPLEXITY: Moderate  PLAN:  PT FREQUENCY: 1-2x/week  PT DURATION: 8 weeks  PLANNED INTERVENTIONS: Therapeutic exercises, Therapeutic activity, Neuromuscular re-education, Balance training, Gait training, Patient/Family education, Self Care, Joint mobilization, and Stair training  PLAN FOR NEXT SESSION: Progress home exercise program. Continue with dynamic balance activities, dual task balance, frontal  plane hip strengthening and hip flexor strengthening   Lenda Kelp, PT 12/07/2022, 7:59 AM

## 2022-12-11 ENCOUNTER — Ambulatory Visit: Payer: Medicare PPO

## 2022-12-11 ENCOUNTER — Ambulatory Visit: Payer: Medicare PPO | Admitting: Physical Therapy

## 2022-12-11 DIAGNOSIS — R269 Unspecified abnormalities of gait and mobility: Secondary | ICD-10-CM

## 2022-12-11 DIAGNOSIS — R2689 Other abnormalities of gait and mobility: Secondary | ICD-10-CM

## 2022-12-11 DIAGNOSIS — M6281 Muscle weakness (generalized): Secondary | ICD-10-CM | POA: Diagnosis not present

## 2022-12-11 NOTE — Therapy (Signed)
OUTPATIENT PHYSICAL THERAPY NEURO TREATMENT   Patient Name: Marie Fisher MRN: [id number] DOB:1945/06/25, 77 y.o., female Today's Date: 12/11/2022   PCP: Marguarite Arbour, MD  REFERRING PROVIDER:  Morene Crocker, MD     END OF SESSION:  PT End of Session - 12/11/22 1535     Visit Number 3    Number of Visits 16    Date for PT Re-Evaluation 01/29/23    Progress Note Due on Visit 10    PT Start Time 1535    PT Stop Time 1615    PT Time Calculation (min) 40 min    Equipment Utilized During Treatment Gait belt    Activity Tolerance Patient tolerated treatment well             Past Medical History:  Diagnosis Date   Allergy    Anemia    Arthritis    Asthma    Cardiomyopathy (HCC)    CHF (congestive heart failure) (HCC)    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   Chronic fatigue syndrome    COPD (chronic obstructive pulmonary disease) (HCC)    Depression    Fibrocystic breast 08/10/2014   GERD (gastroesophageal reflux disease) 08/10/2014   Headache    Hyperlipidemia 08/10/2014   Hypertension    Narcolepsy and cataplexy    OAB (overactive bladder) 08/10/2014   Osteoporosis    Prolapse of female pelvic organs 08/10/2014   Stroke (HCC) 03/27/2008   mini   SVT (supraventricular tachycardia)    Urinary tract infection    recurrent   Vaginal atrophy 08/10/2014   Viral cardiomyopathy (HCC)    Past Surgical History:  Procedure Laterality Date   ABLATION N/A    Cardiac   BACK SURGERY     BLADDER SURGERY     BREAST BIOPSY Bilateral    x3   CARPAL TUNNEL RELEASE     bil.   CERVICAL SPINE SURGERY  1990   COLONOSCOPY WITH PROPOFOL N/A 02/02/2021   Procedure: COLONOSCOPY WITH PROPOFOL;  Surgeon: Toledo, Boykin Nearing, MD;  Location: [facility] ENDOSCOPY;  Service: Gastroenterology;  Laterality: N/A;   ESOPHAGOGASTRODUODENOSCOPY N/A 02/02/2021   Procedure: ESOPHAGOGASTRODUODENOSCOPY (EGD);  Surgeon: Toledo, Boykin Nearing, MD;  Location: [facility] ENDOSCOPY;   Service: Gastroenterology;  Laterality: N/A;  PM arrival   ESOPHAGOGASTRODUODENOSCOPY (EGD) WITH PROPOFOL N/A 09/24/2015   Procedure: ESOPHAGOGASTRODUODENOSCOPY (EGD) WITH PROPOFOL;  Surgeon: Christena Deem, MD;  Location: [facility] ENDOSCOPY;  Service: Endoscopy;  Laterality: N/A;   NECK SURGERY     from front   tension free vaginal tape     TONSILLECTOMY     Patient Active Problem List   Diagnosis Date Noted   Rectocele 08/17/2016   Endocervical polyp 08/17/2016   History of TIA (transient ischemic attack) 06/28/2015   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) 06/28/2015   Shortness of breath on exertion 06/28/2015   Chest pain 06/17/2015   Acquired hypothyroidism 12/24/2014   Essential (primary) hypertension 10/12/2014   Urinary frequency 09/05/2014   Absolute anemia 08/27/2014   BP (high blood pressure) 08/27/2014   Hyperlipidemia 08/10/2014   GERD (gastroesophageal reflux disease) 08/10/2014   Fibrocystic breast 08/10/2014   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   Vaginal atrophy 08/10/2014   Prolapse of female pelvic organs 08/10/2014   OAB (overactive bladder) 08/10/2014   Urinary tract infection    Mitral valve prolapse 12/26/2012   Systolic dysfunction 12/26/2012   Chronic combined systolic and diastolic heart failure (  HCC) 12/26/2012   Supraventricular tachycardia (HCC) 10/07/2012    ONSET DATE: R26.89 (ICD-10-CM) - Imbalance   REFERRING DIAG: R26.89 (ICD-10-CM) - Imbalance   THERAPY DIAG:  Muscle weakness (generalized)  Other abnormalities of gait and mobility  Abnormality of gait and mobility  Rationale for Evaluation and Treatment: Rehabilitation  SUBJECTIVE:                                                                                                                                                                                             SUBJECTIVE STATEMENT:  Pt denies any pain or falls since the last visit.   Pt notes that she has been having increased difficulty with the single leg stance exercises but otherwise is doing well.    Pt accompanied by: self  PERTINENT HISTORY: For the past 4 months pt has not been well. Pt has diverticulitis and IBS. Pt has been to many MDs and the the neurologist referred her here for her balance. The past few weeks she has been dizzy a lot. She reports her dizziness feels unsteady and feels she could pass out at times. Pt sees no correlation with positional changes and her dizziness. Pt reports in the past 4 months it has progressively gotten worse. No changes in medication as of recently, added an antibiotic for UTI about 4 weeks ago but did not notice any changes in balance with this.  Patient has no falls or loss of balance as related to her reported dizziness just periods of unsteadiness where she feels like she could fall.  Patient has history of arthritis, cardiomyopathy, congestive heart failure, chronic fatigue syndrome, COPD, hyperlipidemia, hypertension, osteoporosis, TIA/mini strokes, and narcolepsy.  PAIN:  Are you having pain? No  PRECAUTIONS: None  RED FLAGS: None   WEIGHT BEARING RESTRICTIONS: No  FALLS: Has patient fallen in last 6 months? Yes. Number of falls 1 when she tripped over something. No falls related to the dizziness.   LIVING ENVIRONMENT: Lives with: lives alone Lives in: House/apartment Stairs: Yes: Internal: 13 steps; but doesn't use them regularly Has following equipment at home: None  PLOF: Independent  PATIENT GOALS: Improve balance and be able to walk better.   OBJECTIVE:   DIAGNOSTIC FINDINGS: No evidence of acute intracranial abnormality or reversible cause of memory loss.  COGNITION: Overall cognitive status: Within functional limits for tasks assessed   SENSATION: Not tested     POSTURE: No Significant postural limitations  LOWER EXTREMITY MMT:     MMT  Right Eval Left Eval  Hip flexion 4 4  Hip  extension    Hip abduction 4 4  Hip adduction 5 5  Hip internal rotation    Hip external rotation    Knee flexion 4+ 4+  Knee extension 4+ 4+  Ankle dorsiflexion 5 5  Ankle plantarflexion 5 5  Ankle inversion    Ankle eversion     (Blank rows = not tested)  BED MOBILITY:  No problems   TRANSFERS:no problem with STS transfers, difficulty getting up form a chair in the past few weeks.   Floor:  test at future date   RAMP:  Level of Assistance: Complete Independence Assistive device utilized: None Ramp Comments: pt reports no issues with ramps   CURB:  Level of Assistance:  pt has near LOB when descending steps for curb simulation without UE assist.  Assistive device utilized: None Curb Comments: min A to prevent LOB  STAIRS: Level of Assistance: Min A Stair Negotiation Technique: Alternating Pattern  with No Rails Number of Stairs: 4  Height of Stairs: 6 in  Comments: ascending no LOB, descending required min A and UE assist to prevent LOB, did not clear step at bottom properly.    FUNCTIONAL TESTS:  Five times Sit to Stand Test (FTSS)  TIME: 8 sec  Cut off scores indicative of increased fall risk: >12 sec CVA, >16 sec PD, >13 sec vestibular (ANPTA Core Set of Outcome Measures for Adults with Neurologic Conditions, 2018)  Pt scores 22 / 28 on mini BEST balance test. Scores < 16 indicate increased risk for falls ([place], [place], & [place]) and MCID is 4 (Godi,et al, 2013)      8.3 sec 10 MWT  PATIENT SURVEYS:  FOTO 57  TODAY'S TREATMENT: DATE: 12/11/22  TherEx:  Seated LAQ, 3# AW donned, 2x10 Seated marching, 3# AW donned, 2x10 Seated heel raises, 3# AW donned, 2x10 Seated hip step over cones, 3# AW donned, 2x10 each LE Seated leg press, 30#, 2x10 Standing squats with UE support, 2x10 and verbal and visual cues for proper form   NMR:   Dynamic high knee march on airex pad x 20 reps each LE VOR - Standing gaze stabilization with horizontal head  turns, x10 reps VOR - Standing gaze stabilization with vertical head nods, x10 VOR- Standing smooth pursuit x 10 x 2 trials Ambulation in the hallway with head turns recalling items on wall, x4 total laps Ambulation in the hallway with speed changes for dynamic gait ability, x2 total laps   PATIENT EDUCATION: Education details: POC Person educated: Patient Education method: Explanation Education comprehension: verbalized understanding  HOME EXERCISE PROGRAM:  Access Code: [email or web address] URL: [email or web address] Date: 12/06/2022 Prepared by: Maureen Ralphs  Exercises - Seated Gaze Stabilization with Head Rotation  - 3 x weekly - 3 sets - 10 reps - Seated Horizontal Smooth Pursuit  - 3 x weekly - 3 sets - 10 reps - Tandem Stance  - 3 x weekly - 3 sets - 10 reps - Single Leg Stance  - 3 x weekly - 3-4 sets - 10-20 hold   GOALS: Goals reviewed with patient? Yes  SHORT TERM GOALS: Target date: 01/01/2023   Patient will be independent in home exercise program to improve strength/mobility for better functional independence with ADLs. Baseline: No HEP currently  Goal status: INITIAL   LONG TERM GOALS: Target date: 01/29/2023   1.  Patient will increase FOTO score to equal to or greater than  64   to demonstrate statistically significant improvement in mobility and quality of life.  Baseline: 57 Goal status: INITIAL   2.  Patient will increase Mini BEST Balance score by > 4 points to demonstrate decreased fall risk during functional activities. Baseline: 22 Goal status: INITIAL   3.  Patient will reduce timed up and go with dual task to <11 seconds to reduce fall risk and demonstrate improved transfer/gait ability. Baseline: 13 sec Goal status: INITIAL  4.  Patient will improve bilateral lower extremity single-leg stance to 20 seconds or greater in order to demonstrate improvement in balance and stability. Baseline: 3 seconds on right lower extremity 15  seconds on left lower extremity Goal status: INITIAL  5.  Patient will a send and descend 4 stairs without upper extremity assist in order to improve her safety with stair navigation within the community Baseline: Patient has loss of balance when descending stairs without upper extremity assist initial eval requiring min assist from therapist to prevent fall Goal status: INITIAL     ASSESSMENT:  CLINICAL IMPRESSION:  Pt performed well with the additional hip strengthening exercises performed alongside the balance related exercises.  Pt does note some weakness in the hips with the exercises and also feels as though her ankles may need targeting during future sessions.  Pt overall is making good progress and puts forth good effort throughout the session.   Pt will continue to benefit from skilled therapy to address remaining deficits in order to improve overall QoL and return to PLOF.      OBJECTIVE IMPAIRMENTS: decreased activity tolerance, decreased balance, decreased mobility, and decreased strength.   ACTIVITY LIMITATIONS: standing, squatting, and stairs  PARTICIPATION LIMITATIONS: laundry, community activity, and yard work  PERSONAL FACTORS: Age and 3+ comorbidities: arthritis, cardiomyopathy, congestive heart failure, chronic fatigue syndrome, COPD, hyperlipidemia, hypertension, osteoporosis, TIA/mini strokes, and narcolepsy.  are also affecting patient's functional outcome.   REHAB POTENTIAL: Good  CLINICAL DECISION MAKING: Evolving/moderate complexity  EVALUATION COMPLEXITY: Moderate  PLAN:  PT FREQUENCY: 1-2x/week  PT DURATION: 8 weeks  PLANNED INTERVENTIONS: Therapeutic exercises, Therapeutic activity, Neuromuscular re-education, Balance training, Gait training, Patient/Family education, Self Care, Joint mobilization, and Stair training  PLAN FOR NEXT SESSION: Progress home exercise program. Continue with dynamic balance activities, dual task balance, frontal plane hip  strengthening and hip flexor strengthening.  Include ankle stability exercises during future sessions    Nolon Bussing, PT, DPT Physical Therapist - [facility]  [facility]  12/11/22, 5:36 PM

## 2022-12-13 ENCOUNTER — Ambulatory Visit: Payer: Medicare PPO

## 2022-12-14 ENCOUNTER — Ambulatory Visit: Payer: Medicare PPO

## 2022-12-18 ENCOUNTER — Ambulatory Visit: Payer: Medicare PPO | Admitting: Physical Therapy

## 2022-12-20 ENCOUNTER — Ambulatory Visit: Payer: Medicare PPO

## 2022-12-25 ENCOUNTER — Ambulatory Visit: Payer: Medicare PPO

## 2022-12-27 ENCOUNTER — Ambulatory Visit: Payer: Medicare PPO

## 2023-01-01 ENCOUNTER — Ambulatory Visit: Payer: Medicare PPO | Admitting: Physical Therapy

## 2023-01-03 ENCOUNTER — Other Ambulatory Visit: Payer: Self-pay | Admitting: Internal Medicine

## 2023-01-03 ENCOUNTER — Ambulatory Visit: Payer: Medicare PPO

## 2023-01-03 DIAGNOSIS — Z1231 Encounter for screening mammogram for malignant neoplasm of breast: Secondary | ICD-10-CM

## 2023-01-08 ENCOUNTER — Ambulatory Visit: Payer: Medicare PPO | Admitting: Physical Therapy

## 2023-01-10 ENCOUNTER — Ambulatory Visit: Payer: Medicare PPO | Admitting: Physical Therapy

## 2023-01-10 ENCOUNTER — Ambulatory Visit: Payer: Medicare PPO

## 2023-01-15 ENCOUNTER — Ambulatory Visit: Payer: Medicare PPO | Admitting: Physical Therapy

## 2023-01-17 ENCOUNTER — Ambulatory Visit: Payer: Medicare PPO | Admitting: Physical Therapy

## 2023-01-22 ENCOUNTER — Ambulatory Visit: Payer: Medicare PPO | Admitting: Physical Therapy

## 2023-01-24 ENCOUNTER — Ambulatory Visit: Payer: Medicare PPO | Admitting: Physical Therapy

## 2023-01-29 ENCOUNTER — Ambulatory Visit: Payer: Medicare PPO | Attending: Neurology | Admitting: Physical Therapy

## 2023-01-29 DIAGNOSIS — M25542 Pain in joints of left hand: Secondary | ICD-10-CM | POA: Insufficient documentation

## 2023-01-29 DIAGNOSIS — R269 Unspecified abnormalities of gait and mobility: Secondary | ICD-10-CM | POA: Insufficient documentation

## 2023-01-29 DIAGNOSIS — R2689 Other abnormalities of gait and mobility: Secondary | ICD-10-CM | POA: Diagnosis present

## 2023-01-29 DIAGNOSIS — M6281 Muscle weakness (generalized): Secondary | ICD-10-CM | POA: Insufficient documentation

## 2023-01-29 NOTE — Therapy (Unsigned)
OUTPATIENT PHYSICAL THERAPY NEURO TREATMENT   Patient Name: Marie Fisher MRN: [id number] DOB:[birth year], 77 y.o., female Today's Date: 01/30/2023   PCP: Marguarite Arbour, MD  REFERRING PROVIDER:  Morene Crocker, MD     END OF SESSION:  PT End of Session - 01/30/23 0753     Visit Number 4    Number of Visits 16    Date for PT Re-Evaluation 01/29/23    Progress Note Due on Visit 10    PT Start Time 1615    PT Stop Time 1657    PT Time Calculation (min) 42 min    Equipment Utilized During Treatment Gait belt    Activity Tolerance Patient tolerated treatment well              Past Medical History:  Diagnosis Date   Allergy    Anemia    Arthritis    Asthma    Cardiomyopathy (HCC)    CHF (congestive heart failure) (HCC)    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   Chronic fatigue syndrome    COPD (chronic obstructive pulmonary disease) (HCC)    Depression    Fibrocystic breast 08/10/2014   GERD (gastroesophageal reflux disease) 08/10/2014   Headache    Hyperlipidemia 08/10/2014   Hypertension    Narcolepsy and cataplexy    OAB (overactive bladder) 08/10/2014   Osteoporosis    Prolapse of female pelvic organs 08/10/2014   Stroke (HCC) 03/27/2008   mini   SVT (supraventricular tachycardia)    Urinary tract infection    recurrent   Vaginal atrophy 08/10/2014   Viral cardiomyopathy (HCC)    Past Surgical History:  Procedure Laterality Date   ABLATION N/A    Cardiac   BACK SURGERY     BLADDER SURGERY     BREAST BIOPSY Bilateral    x3   CARPAL TUNNEL RELEASE     bil.   CERVICAL SPINE SURGERY  1990   COLONOSCOPY WITH PROPOFOL N/A 02/02/2021   Procedure: COLONOSCOPY WITH PROPOFOL;  Surgeon: Toledo, Boykin Nearing, MD;  Location: [facility] ENDOSCOPY;  Service: Gastroenterology;  Laterality: N/A;   ESOPHAGOGASTRODUODENOSCOPY N/A 02/02/2021   Procedure: ESOPHAGOGASTRODUODENOSCOPY (EGD);  Surgeon: Toledo, Boykin Nearing, MD;  Location: [facility]  ENDOSCOPY;  Service: Gastroenterology;  Laterality: N/A;  PM arrival   ESOPHAGOGASTRODUODENOSCOPY (EGD) WITH PROPOFOL N/A 09/24/2015   Procedure: ESOPHAGOGASTRODUODENOSCOPY (EGD) WITH PROPOFOL;  Surgeon: Christena Deem, MD;  Location[facility] ENDOSCOPY;  Service: Endoscopy;  Laterality: N/A;   NECK SURGERY     from front   tension free vaginal tape     TONSILLECTOMY     Patient Active Problem List   Diagnosis Date Noted   Rectocele 08/17/2016   Endocervical polyp 08/17/2016   History of TIA (transient ischemic attack) 06/28/2015   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) 06/28/2015   Shortness of breath on exertion 06/28/2015   Chest pain 06/17/2015   Acquired hypothyroidism 12/24/2014   Essential (primary) hypertension 10/12/2014   Urinary frequency 09/05/2014   Absolute anemia 08/27/2014   BP (high blood pressure) 08/27/2014   Hyperlipidemia 08/10/2014   GERD (gastroesophageal reflux disease) 08/10/2014   Fibrocystic breast 08/10/2014   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   Vaginal atrophy 08/10/2014   Prolapse of female pelvic organs 08/10/2014   OAB (overactive bladder) 08/10/2014   Urinary tract infection    Mitral valve prolapse 12/26/2012   Systolic dysfunction 12/26/2012   Chronic combined systolic and diastolic heart  failure (HCC) 12/26/2012   Supraventricular tachycardia (HCC) 10/07/2012    ONSET DATE: R26.89 (ICD-10-CM) - Imbalance   REFERRING DIAG: R26.89 (ICD-10-CM) - Imbalance   THERAPY DIAG:  Muscle weakness (generalized)  Other abnormalities of gait and mobility  Abnormality of gait and mobility  Rationale for Evaluation and Treatment: Rehabilitation  SUBJECTIVE:                                                                                                                                                                                             SUBJECTIVE STATEMENT:  Pt reports continued intense and random  spells of dizziness that cause her to be bedridden for days. She is unable to determine any potential causative factor at this time for positional or exertion, etc. Pt open to vestibular screen from fellow PT.    Pt accompanied by: self  PERTINENT HISTORY: For the past 4 months pt has not been well. Pt has diverticulitis and IBS. Pt has been to many MDs and the the neurologist referred her here for her balance. The past few weeks she has been dizzy a lot. She reports her dizziness feels unsteady and feels she could pass out at times. Pt sees no correlation with positional changes and her dizziness. Pt reports in the past 4 months it has progressively gotten worse. No changes in medication as of recently, added an antibiotic for UTI about 4 weeks ago but did not notice any changes in balance with this.  Patient has no falls or loss of balance as related to her reported dizziness just periods of unsteadiness where she feels like she could fall.  Patient has history of arthritis, cardiomyopathy, congestive heart failure, chronic fatigue syndrome, COPD, hyperlipidemia, hypertension, osteoporosis, TIA/mini strokes, and narcolepsy.  PAIN:  Are you having pain? No  PRECAUTIONS: None  RED FLAGS: None   WEIGHT BEARING RESTRICTIONS: No  FALLS: Has patient fallen in last 6 months? Yes. Number of falls 1 when she tripped over something. No falls related to the dizziness.   LIVING ENVIRONMENT: Lives with: lives alone Lives in: House/apartment Stairs: Yes: Internal: 13 steps; but doesn't use them regularly Has following equipment at home: None  PLOF: Independent  PATIENT GOALS: Improve balance and be able to walk better.   OBJECTIVE:   DIAGNOSTIC FINDINGS: No evidence of acute intracranial abnormality or reversible cause of memory loss.  COGNITION: Overall cognitive status: Within functional limits for tasks assessed   SENSATION: Not tested     POSTURE: No Significant postural  limitations  LOWER EXTREMITY MMT:     MMT  Right Eval Left Eval  Hip flexion 4  4  Hip extension    Hip abduction 4 4  Hip adduction 5 5  Hip internal rotation    Hip external rotation    Knee flexion 4+ 4+  Knee extension 4+ 4+  Ankle dorsiflexion 5 5  Ankle plantarflexion 5 5  Ankle inversion    Ankle eversion     (Blank rows = not tested)  BED MOBILITY:  No problems   TRANSFERS:no problem with STS transfers, difficulty getting up form a chair in the past few weeks.   Floor:  test at future date   RAMP:  Level of Assistance: Complete Independence Assistive device utilized: None Ramp Comments: pt reports no issues with ramps   CURB:  Level of Assistance:  pt has near LOB when descending steps for curb simulation without UE assist.  Assistive device utilized: None Curb Comments: min A to prevent LOB  STAIRS: Level of Assistance: Min A Stair Negotiation Technique: Alternating Pattern  with No Rails Number of Stairs: 4  Height of Stairs: 6 in  Comments: ascending no LOB, descending required min A and UE assist to prevent LOB, did not clear step at bottom properly.    FUNCTIONAL TESTS:  Five times Sit to Stand Test (FTSS)  TIME: 8 sec  Cut off scores indicative of increased fall risk: >12 sec CVA, >16 sec PD, >13 sec vestibular (ANPTA Core Set of Outcome Measures for Adults with Neurologic Conditions, 2018)  Pt scores 22 / 28 on mini BEST balance test. Scores < 16 indicate increased risk for falls Para March, Halma, & [place]) and MCID is 4 (Godi,et al, 2013)   8.3 sec 10 MWT  PATIENT SURVEYS:  FOTO 57  TODAY'S TREATMENT: DATE: 01/30/23  Vest Screen by Temple Pacini PT, DPT Vestibular Screen: Ocular ROM: WNL End-gaze nystagmus: absent Gaze-holding nystagmus: absent Smooth pursuits: mildly saccadic, could be WNL for age Saccades: catch-up saccade with R gaze Slow VOR: WNL Other comments- hx reported by pt: random onset of swimmy-headed sensation, not  positionally provoked, and can last for hours not suggestive of peripheral origin for dizziness. Pt would still benefit from future BPPV screen to rule this out. Pt does have possible central indicators AEB abnormal saccade testing and possible abnormal smooth pursuits. No dizziness with testing   Assessments   TherEx:  Seated LAQ, 3# AW donned, 2x12 Seated marching, 3# AW donned, 2x10 standing heel raises, 4# AW donned, 2x10 Standing side step over 1/2 foam rollers, 4# AW donned, 2x10 each LE Standing squats with UE support, 2x10 and verbal and visual cues for proper form  NMR:   Dynamic step on 6 inch and 3 inch simulated curb x 10 on ea, some difficulty with foot clearance -added airex pad to 3 inch step for another round of 10 ea, similar intermittent difficulty with foot clearance Dynamic high knee march on airex pad x 20 reps each LE SLS x 30 sec ea  SLS with ball toss x 10 tosses ea LE     PATIENT EDUCATION: Education details: POC Person educated: Patient Education method: Explanation Education comprehension: verbalized understanding  HOME EXERCISE PROGRAM:  Access Code: [email or web address] URL: [email or web address] Date: 12/06/2022 Prepared by: Maureen Ralphs  Exercises - Seated Gaze Stabilization with Head Rotation  - 3 x weekly - 3 sets - 10 reps - Seated Horizontal Smooth Pursuit  - 3 x weekly - 3 sets - 10 reps - Tandem Stance  - 3 x weekly - 3 sets - 10 reps - Single Leg Stance  -  3 x weekly - 3-4 sets - 10-20 hold   GOALS: Goals reviewed with patient? Yes  SHORT TERM GOALS: Target date: 01/01/2023   Patient will be independent in home exercise program to improve strength/mobility for better functional independence with ADLs. Baseline: No HEP currently  Goal status: INITIAL   LONG TERM GOALS: Target date: 01/29/2023   1.  Patient will increase FOTO score to equal to or greater than  64   to demonstrate statistically significant improvement in  mobility and quality of life.  Baseline: 57 Goal status: INITIAL   2.  Patient will increase Mini BEST Balance score by > 4 points to demonstrate decreased fall risk during functional activities. Baseline: 22 Goal status: INITIAL   3.  Patient will reduce timed up and go with dual task to <11 seconds to reduce fall risk and demonstrate improved transfer/gait ability. Baseline: 13 sec Goal status: INITIAL  4.  Patient will improve bilateral lower extremity single-leg stance to 20 seconds or greater in order to demonstrate improvement in balance and stability. Baseline: 3 seconds on right lower extremity 15 seconds on left lower extremity Goal status: INITIAL  5.  Patient will a send and descend 4 stairs without upper extremity assist in order to improve her safety with stair navigation within the community.  Baseline: Patient has loss of balance when descending stairs without upper extremity assist initial eval requiring min assist from therapist to prevent fall Goal status: INITIAL     ASSESSMENT:  CLINICAL IMPRESSION:  Patient presents with continued difficulty with dizziness.  Vestibular screen provided by fellow physical therapist as described in objective section, no significant findings were found in vestibular screen.  Will continue to monitor and future visits and will provide him more detailed test of BPPV via same therapist next session if possible.  Patient continuing to have dizziness that is unknown and because shows overall good responses to high-level balance perturbations this date.  Pt will continue to benefit from skilled therapy to address remaining deficits in order to improve overall QoL and return to PLOF.      OBJECTIVE IMPAIRMENTS: decreased activity tolerance, decreased balance, decreased mobility, and decreased strength.   ACTIVITY LIMITATIONS: standing, squatting, and stairs  PARTICIPATION LIMITATIONS: laundry, community activity, and yard work  PERSONAL  FACTORS: Age and 3+ comorbidities: arthritis, cardiomyopathy, congestive heart failure, chronic fatigue syndrome, COPD, hyperlipidemia, hypertension, osteoporosis, TIA/mini strokes, and narcolepsy.  are also affecting patient's functional outcome.   REHAB POTENTIAL: Good  CLINICAL DECISION MAKING: Evolving/moderate complexity  EVALUATION COMPLEXITY: Moderate  PLAN:  PT FREQUENCY: 1-2x/week  PT DURATION: 8 weeks  PLANNED INTERVENTIONS: Therapeutic exercises, Therapeutic activity, Neuromuscular re-education, Balance training, Gait training, Patient/Family education, Self Care, Joint mobilization, and Stair training  PLAN FOR NEXT SESSION: Progress home exercise program. Continue with dynamic balance activities, dual task balance, frontal plane hip strengthening and hip flexor strengthening.  Include ankle stability exercises during future sessions   Norman Herrlich PT ,DPT Physical Therapist- [facility]  [facility]   01/30/23, 7:54 AM

## 2023-01-31 ENCOUNTER — Ambulatory Visit: Payer: Medicare PPO | Admitting: Physical Therapy

## 2023-01-31 DIAGNOSIS — M6281 Muscle weakness (generalized): Secondary | ICD-10-CM

## 2023-01-31 DIAGNOSIS — R269 Unspecified abnormalities of gait and mobility: Secondary | ICD-10-CM

## 2023-01-31 DIAGNOSIS — R2689 Other abnormalities of gait and mobility: Secondary | ICD-10-CM

## 2023-01-31 NOTE — Therapy (Addendum)
OUTPATIENT PHYSICAL THERAPY NEURO TREATMENT/RECERT   Patient Name: Marie Fisher MRN: [id number] DOB:1945/06/27, 77 y.o., female Today's Date: 02/01/2023   PCP: Marguarite Arbour, MD  REFERRING PROVIDER:  Morene Crocker, MD     END OF SESSION:  PT End of Session - 01/31/23 1538     Visit Number 5    Number of Visits 16    Date for PT Re-Evaluation 01/29/23    Progress Note Due on Visit 10    PT Start Time 1537    PT Stop Time 1615    PT Time Calculation (min) 38 min    Equipment Utilized During Treatment Gait belt    Activity Tolerance Patient tolerated treatment well              Past Medical History:  Diagnosis Date   Allergy    Anemia    Arthritis    Asthma    Cardiomyopathy (HCC)    CHF (congestive heart failure) (HCC)    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   Chronic fatigue syndrome    COPD (chronic obstructive pulmonary disease) (HCC)    Depression    Fibrocystic breast 08/10/2014   GERD (gastroesophageal reflux disease) 08/10/2014   Headache    Hyperlipidemia 08/10/2014   Hypertension    Narcolepsy and cataplexy    OAB (overactive bladder) 08/10/2014   Osteoporosis    Prolapse of female pelvic organs 08/10/2014   Stroke (HCC) 03/27/2008   mini   SVT (supraventricular tachycardia)    Urinary tract infection    recurrent   Vaginal atrophy 08/10/2014   Viral cardiomyopathy (HCC)    Past Surgical History:  Procedure Laterality Date   ABLATION N/A    Cardiac   BACK SURGERY     BLADDER SURGERY     BREAST BIOPSY Bilateral    x3   CARPAL TUNNEL RELEASE     bil.   CERVICAL SPINE SURGERY  1990   COLONOSCOPY WITH PROPOFOL N/A 02/02/2021   Procedure: COLONOSCOPY WITH PROPOFOL;  Surgeon: Toledo, Boykin Nearing, MD;  Location: [facility] ENDOSCOPY;  Service: Gastroenterology;  Laterality: N/A;   ESOPHAGOGASTRODUODENOSCOPY N/A 02/02/2021   Procedure: ESOPHAGOGASTRODUODENOSCOPY (EGD);  Surgeon: Toledo, Boykin Nearing, MD;  Location: [facility]  ENDOSCOPY;  Service: Gastroenterology;  Laterality: N/A;  PM arrival   ESOPHAGOGASTRODUODENOSCOPY (EGD) WITH PROPOFOL N/A 09/24/2015   Procedure: ESOPHAGOGASTRODUODENOSCOPY (EGD) WITH PROPOFOL;  Surgeon: Christena Deem, MD;  Location: [facility] ENDOSCOPY;  Service: Endoscopy;  Laterality: N/A;   NECK SURGERY     from front   tension free vaginal tape     TONSILLECTOMY     Patient Active Problem List   Diagnosis Date Noted   Rectocele 08/17/2016   Endocervical polyp 08/17/2016   History of TIA (transient ischemic attack) 06/28/2015   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) 06/28/2015   Shortness of breath on exertion 06/28/2015   Chest pain 06/17/2015   Acquired hypothyroidism 12/24/2014   Essential (primary) hypertension 10/12/2014   Urinary frequency 09/05/2014   Absolute anemia 08/27/2014   BP (high blood pressure) 08/27/2014   Hyperlipidemia 08/10/2014   GERD (gastroesophageal reflux disease) 08/10/2014   Fibrocystic breast 08/10/2014   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   Vaginal atrophy 08/10/2014   Prolapse of female pelvic organs 08/10/2014   OAB (overactive bladder) 08/10/2014   Urinary tract infection    Mitral valve prolapse 12/26/2012   Systolic dysfunction 12/26/2012   Chronic combined systolic and diastolic heart  failure (HCC) 12/26/2012   Supraventricular tachycardia (HCC) 10/07/2012    ONSET DATE: R26.89 (ICD-10-CM) - Imbalance   REFERRING DIAG: R26.89 (ICD-10-CM) - Imbalance   THERAPY DIAG:  Muscle weakness (generalized)  Other abnormalities of gait and mobility  Abnormality of gait and mobility  Rationale for Evaluation and Treatment: Rehabilitation  SUBJECTIVE:                                                                                                                                                                                             SUBJECTIVE STATEMENT:  Patient reports no significant changes  since previous session.  She had 1 mild episode of dizziness this morning that resolved relatively quickly.  Pt accompanied by: self  PERTINENT HISTORY: For the past 4 months pt has not been well. Pt has diverticulitis and IBS. Pt has been to many MDs and the the neurologist referred her here for her balance. The past few weeks she has been dizzy a lot. She reports her dizziness feels unsteady and feels she could pass out at times. Pt sees no correlation with positional changes and her dizziness. Pt reports in the past 4 months it has progressively gotten worse. No changes in medication as of recently, added an antibiotic for UTI about 4 weeks ago but did not notice any changes in balance with this.  Patient has no falls or loss of balance as related to her reported dizziness just periods of unsteadiness where she feels like she could fall.  Patient has history of arthritis, cardiomyopathy, congestive heart failure, chronic fatigue syndrome, COPD, hyperlipidemia, hypertension, osteoporosis, TIA/mini strokes, and narcolepsy.  PAIN:  Are you having pain? No  PRECAUTIONS: None  RED FLAGS: None   WEIGHT BEARING RESTRICTIONS: No  FALLS: Has patient fallen in last 6 months? Yes. Number of falls 1 when she tripped over something. No falls related to the dizziness.   LIVING ENVIRONMENT: Lives with: lives alone Lives in: House/apartment Stairs: Yes: Internal: 13 steps; but doesn't use them regularly Has following equipment at home: None  PLOF: Independent  PATIENT GOALS: Improve balance and be able to walk better.   OBJECTIVE:   DIAGNOSTIC FINDINGS: No evidence of acute intracranial abnormality or reversible cause of memory loss.  COGNITION: Overall cognitive status: Within functional limits for tasks assessed   SENSATION: Not tested     POSTURE: No Significant postural limitations  LOWER EXTREMITY MMT:     MMT  Right Eval Left Eval  Hip flexion 4 4  Hip extension    Hip  abduction 4 4  Hip adduction 5 5  Hip internal rotation  Hip external rotation    Knee flexion 4+ 4+  Knee extension 4+ 4+  Ankle dorsiflexion 5 5  Ankle plantarflexion 5 5  Ankle inversion    Ankle eversion     (Blank rows = not tested)  BED MOBILITY:  No problems   TRANSFERS:no problem with STS transfers, difficulty getting up form a chair in the past few weeks.   Floor:  test at future date   RAMP:  Level of Assistance: Complete Independence Assistive device utilized: None Ramp Comments: pt reports no issues with ramps   CURB:  Level of Assistance:  pt has near LOB when descending steps for curb simulation without UE assist.  Assistive device utilized: None Curb Comments: min A to prevent LOB  STAIRS: Level of Assistance: Min A Stair Negotiation Technique: Alternating Pattern  with No Rails Number of Stairs: 4  Height of Stairs: 6 in  Comments: ascending no LOB, descending required min A and UE assist to prevent LOB, did not clear step at bottom properly.    FUNCTIONAL TESTS:  Five times Sit to Stand Test (FTSS)  TIME: 8 sec  Cut off scores indicative of increased fall risk: >12 sec CVA, >16 sec PD, >13 sec vestibular (ANPTA Core Set of Outcome Measures for Adults with Neurologic Conditions, 2018)  Pt scores 22 / 28 on mini BEST balance test. Scores < 16 indicate increased risk for falls ([place], [place], & [place]) and MCID is 4 (Godi,et al, 2013)   8.3 sec 10 MWT  PATIENT SURVEYS:  FOTO 57  TODAY'S TREATMENT:  Unless otherwise stated, CGA was provided and gait belt donned in order to ensure pt safety   NMR [place] balance trainer working on patients proprioception and standing balance on dynamic surface  Setting type(s): tux racer ( [place] x 2, in search of vdk x 1)  Reps: 3 Comments: difficulty with forward and post weight shift    TherEx:  Seated LAQ, 4# AW donned, 2x12 Seated marching, 4# AW donned, 2x10 standing heel raises, 4# AW donned,  2x15 with 3 second holds     NMR:   Rocker board rocks anterior to posterior x 20 Rocker board maintenance of stance x 30 seconds.  No significant ankle righting reactions needed progressed with the following Rocker board with vertical head nods 3 x 10 reps causing patient to utilize ankle righting strategies throughout. Airex pad then Airex beam with another Airex pad set up in line -3 times through walking in near tandem on Airex beam and turning on Airex pad.  Difficulty with balance and righting strategies -Lateral sidestepping same as above Ambulation in hallway with ball toss to self and cues for vertical head nods.  Significant difficulty with this.  Utilized 3 kg ball    PATIENT EDUCATION: Education details: POC Person educated: Patient Education method: Explanation Education comprehension: verbalized understanding  HOME EXERCISE PROGRAM:  Access Code: [email or web address] URL: [email or web address] Date: 12/06/2022 Prepared by: Maureen Ralphs  Exercises - Seated Gaze Stabilization with Head Rotation  - 3 x weekly - 3 sets - 10 reps - Seated Horizontal Smooth Pursuit  - 3 x weekly - 3 sets - 10 reps - Tandem Stance  - 3 x weekly - 3 sets - 10 reps - Single Leg Stance  - 3 x weekly - 3-4 sets - 10-20 hold   GOALS: Goals reviewed with patient? Yes  SHORT TERM GOALS: Target date: 01/01/2023   Patient will be independent in home exercise program to improve strength/mobility  for better functional independence with ADLs. Baseline: No HEP currently  Goal status: INITIAL   LONG TERM GOALS: Target date: 01/29/2023   1.  Patient will increase FOTO score to equal to or greater than  64   to demonstrate statistically significant improvement in mobility and quality of life.  Baseline: 57 Goal status: INITIAL   2.  Patient will increase Mini BEST Balance score by > 4 points to demonstrate decreased fall risk during functional activities. Baseline: 22 Goal status:  INITIAL   3.  Patient will reduce timed up and go with dual task to <11 seconds to reduce fall risk and demonstrate improved transfer/gait ability. Baseline: 13 sec Goal status: INITIAL  4.  Patient will improve bilateral lower extremity single-leg stance to 20 seconds or greater in order to demonstrate improvement in balance and stability. Baseline: 3 seconds on right lower extremity 15 seconds on left lower extremity Goal status: INITIAL  5.  Patient will a send and descend 4 stairs without upper extremity assist in order to improve her safety with stair navigation within the community.  Baseline: Patient has loss of balance when descending stairs without upper extremity assist initial eval requiring min assist from therapist to prevent fall Goal status: INITIAL     ASSESSMENT:  CLINICAL IMPRESSION:  Patient presents with continued difficulty with dizziness.  Patient due for recert note this date but due to recency of initial evaluation in terms of visit count we will assess goals at next session.  Please refer to next session for any updates with goals.  Patient is showing improvement in her balance but still has significant issues with dizziness.  Patient challenged with high-level balance on core balance machine showing difficulty with anterior to posterior balance righting reaction strategies.  Patient further challenged with rocker board in anterior to posterior direction working on sagittal plane corrections with ankle strategies.  Patient also challenged with ball toss with vertical head nods.  Pt will continue to benefit from skilled therapy to address remaining deficits in order to improve overall QoL and return to PLOF.      OBJECTIVE IMPAIRMENTS: decreased activity tolerance, decreased balance, decreased mobility, and decreased strength.   ACTIVITY LIMITATIONS: standing, squatting, and stairs  PARTICIPATION LIMITATIONS: laundry, community activity, and yard work  PERSONAL  FACTORS: Age and 3+ comorbidities: arthritis, cardiomyopathy, congestive heart failure, chronic fatigue syndrome, COPD, hyperlipidemia, hypertension, osteoporosis, TIA/mini strokes, and narcolepsy.  are also affecting patient's functional outcome.   REHAB POTENTIAL: Good  CLINICAL DECISION MAKING: Evolving/moderate complexity  EVALUATION COMPLEXITY: Moderate  PLAN:  PT FREQUENCY: 1-2x/week  PT DURATION: 8 weeks  PLANNED INTERVENTIONS: Therapeutic exercises, Therapeutic activity, Neuromuscular re-education, Balance training, Gait training, Patient/Family education, Self Care, Joint mobilization, and Stair training  PLAN FOR NEXT SESSION: Progress home exercise program. Continue with dynamic balance activities, dual task balance, frontal plane hip strengthening and hip flexor strengthening.  Include ankle stability exercises during future sessions   Norman Herrlich PT ,DPT Physical Therapist- [facility]  [facility]   02/01/23, 7:42 AM

## 2023-02-05 ENCOUNTER — Ambulatory Visit: Payer: Medicare PPO | Admitting: Physical Therapy

## 2023-02-05 ENCOUNTER — Telehealth: Payer: Self-pay

## 2023-02-05 NOTE — Telephone Encounter (Signed)
Called patient and patient verbalized understanding  ?

## 2023-02-05 NOTE — Therapy (Deleted)
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   Chronic fatigue syndrome    COPD (chronic obstructive pulmonary disease) (HCC)    Depression    Fibrocystic breast 08/10/2014   GERD (gastroesophageal reflux disease) 08/10/2014   Headache    Hyperlipidemia 08/10/2014   Hypertension    Narcolepsy and cataplexy    OAB (overactive bladder) 08/10/2014   Osteoporosis    Prolapse of female pelvic organs 08/10/2014   Stroke (HCC) 03/27/2008   mini   SVT (supraventricular tachycardia)    Urinary tract infection    recurrent   Vaginal atrophy 08/10/2014   Viral cardiomyopathy (HCC)    Past Surgical History:  Procedure Laterality Date   ABLATION N/A    Cardiac   BACK SURGERY     BLADDER SURGERY     BREAST BIOPSY Bilateral    x3   CARPAL TUNNEL RELEASE     bil.   CERVICAL SPINE SURGERY  1990   COLONOSCOPY WITH PROPOFOL N/A 02/02/2021   Procedure: COLONOSCOPY WITH PROPOFOL;  Surgeon: Toledo, Boykin Nearing, MD;  Location: [facility] ENDOSCOPY;  Service: Gastroenterology;  Laterality: N/A;   ESOPHAGOGASTRODUODENOSCOPY N/A 02/02/2021   Procedure: ESOPHAGOGASTRODUODENOSCOPY (EGD);  Surgeon: Toledo, Boykin Nearing, MD;  Location: [facility] ENDOSCOPY;  Service: Gastroenterology;  Laterality: N/A;  PM arrival   ESOPHAGOGASTRODUODENOSCOPY (EGD) WITH PROPOFOL N/A 09/24/2015   Procedure: ESOPHAGOGASTRODUODENOSCOPY (EGD) WITH PROPOFOL;  Surgeon: Christena Deem, MD;  Location: [facility];  Service: Endoscopy;  Laterality: N/A;   NECK SURGERY     from front   tension free vaginal tape      TONSILLECTOMY     Patient Active Problem List   Diagnosis Date Noted   Rectocele 08/17/2016   Endocervical polyp 08/17/2016   History of TIA (transient ischemic attack) 06/28/2015   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) 06/28/2015   Shortness of breath on exertion 06/28/2015   Chest pain 06/17/2015   Acquired hypothyroidism 12/24/2014   Essential (primary) hypertension 10/12/2014   Urinary frequency 09/05/2014   Absolute anemia 08/27/2014   BP (high blood pressure) 08/27/2014   Hyperlipidemia 08/10/2014   GERD (gastroesophageal reflux disease) 08/10/2014   Fibrocystic breast 08/10/2014   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  Rationale for Evaluation and Treatment: Rehabilitation  SUBJECTIVE:  SUBJECTIVE STATEMENT:  Patient reports no significant changes since previous session.  She had 1 mild episode of dizziness this morning that resolved relatively quickly.  Pt accompanied by: self  PERTINENT HISTORY: For the past 4 months pt has not been well. Pt has diverticulitis and IBS. Pt has been to many MDs and the the neurologist referred her here for her balance. The past few weeks she has been dizzy a lot. She reports her dizziness feels unsteady and feels she could pass out at times. Pt sees no  correlation with positional changes and her dizziness. Pt reports in the past 4 months it has progressively gotten worse. No changes in medication as of recently, added an antibiotic for UTI about 4 weeks ago but did not notice any changes in balance with this.  Patient has no falls or loss of balance as related to her reported dizziness just periods of unsteadiness where she feels like she could fall.  Patient has history of arthritis, cardiomyopathy, congestive heart failure, chronic fatigue syndrome, COPD, hyperlipidemia, hypertension, osteoporosis, TIA/mini strokes, and narcolepsy.  PAIN:  Are you having pain? No  PRECAUTIONS: None  RED FLAGS: None   WEIGHT BEARING RESTRICTIONS: No  FALLS: Has patient fallen in last 6 months? Yes. Number of falls 1 when she tripped over something. No falls related to the dizziness.   LIVING ENVIRONMENT: Lives with: lives alone Lives in: House/apartment Stairs: Yes: Internal: 13 steps; but doesn't use them regularly Has following equipment at home: None  PLOF: Independent  PATIENT GOALS: Improve balance and be able to walk better.   OBJECTIVE:   DIAGNOSTIC FINDINGS: No evidence of acute intracranial abnormality or reversible cause of memory loss.  COGNITION: Overall cognitive status: Within functional limits for tasks assessed   SENSATION: Not tested     POSTURE: No Significant postural limitations  LOWER EXTREMITY MMT:     MMT  Right Eval Left Eval  Hip flexion 4 4  Hip extension    Hip abduction 4 4  Hip adduction 5 5  Hip internal rotation    Hip external rotation    Knee flexion 4+ 4+  Knee extension 4+ 4+  Ankle dorsiflexion 5 5  Ankle plantarflexion 5 5  Ankle inversion    Ankle eversion     (Blank rows = not tested)  BED MOBILITY:  No problems   TRANSFERS:no problem with STS transfers, difficulty getting up form a chair in the past few weeks.   Floor:  test at future date   RAMP:  Level of Assistance:  Complete Independence Assistive device utilized: None Ramp Comments: pt reports no issues with ramps   CURB:  Level of Assistance:  pt has near LOB when descending steps for curb simulation without UE assist.  Assistive device utilized: None Curb Comments: min A to prevent LOB  STAIRS: Level of Assistance: Min A Stair Negotiation Technique: Alternating Pattern  with No Rails Number of Stairs: 4  Height of Stairs: 6 in  Comments: ascending no LOB, descending required min A and UE assist to prevent LOB, did not clear step at bottom properly.    FUNCTIONAL TESTS:  Five times Sit to Stand Test (FTSS)  TIME: 8 sec  Cut off scores indicative of increased fall risk: >12 sec CVA, >16 sec PD, >13 sec vestibular (ANPTA Core Set of Outcome Measures for Adults with Neurologic Conditions, 2018)  Pt scores 22 / 28 on mini BEST balance test. Scores < 16 indicate increased risk for falls Para March,  Leddy, & Earhart) and MCID is 4 (Godi,et al, 2013)   8.3 sec 10 MWT  PATIENT SURVEYS:  FOTO 57  TODAY'S TREATMENT:  Unless otherwise stated, CGA was provided and gait belt donned in order to ensure pt safety   NMR [place] balance trainer working on patients proprioception and standing balance on dynamic surface  Setting type(s): tux racer ( [place] x 2, in search of vdk x 1)  Reps: 3 Comments: difficulty with forward and post weight shift    TherEx:  Seated LAQ, 4# AW donned, 2x12 Seated marching, 4# AW donned, 2x10 standing heel raises, 4# AW donned, 2x15 with 3 second holds     NMR:   Rocker board rocks anterior to posterior x 20 Rocker board maintenance of stance x 30 seconds.  No significant ankle righting reactions needed progressed with the following Rocker board with vertical head nods 3 x 10 reps causing patient to utilize ankle righting strategies throughout. Airex pad then Airex beam with another Airex pad set up in line -3 times through walking in near tandem on Airex beam  and turning on Airex pad.  Difficulty with balance and righting strategies -Lateral sidestepping same as above Ambulation in hallway with ball toss to self and cues for vertical head nods.  Significant difficulty with this.  Utilized 3 kg ball    PATIENT EDUCATION: Education details: POC Person educated: Patient Education method: Explanation Education comprehension: verbalized understanding  HOME EXERCISE PROGRAM:  Access Code: [email or web address] URL: [email or web address] Date: 12/06/2022 Prepared by: Maureen Ralphs  Exercises - Seated Gaze Stabilization with Head Rotation  - 3 x weekly - 3 sets - 10 reps - Seated Horizontal Smooth Pursuit  - 3 x weekly - 3 sets - 10 reps - Tandem Stance  - 3 x weekly - 3 sets - 10 reps - Single Leg Stance  - 3 x weekly - 3-4 sets - 10-20 hold   GOALS: Goals reviewed with patient? Yes  SHORT TERM GOALS: Target date: 01/01/2023   Patient will be independent in home exercise program to improve strength/mobility for better functional independence with ADLs. Baseline: No HEP currently  Goal status: INITIAL   LONG TERM GOALS: Target date: 01/29/2023   1.  Patient will increase FOTO score to equal to or greater than  64   to demonstrate statistically significant improvement in mobility and quality of life.  Baseline: 57 Goal status: INITIAL   2.  Patient will increase Mini BEST Balance score by > 4 points to demonstrate decreased fall risk during functional activities. Baseline: 22 Goal status: INITIAL   3.  Patient will reduce timed up and go with dual task to <11 seconds to reduce fall risk and demonstrate improved transfer/gait ability. Baseline: 13 sec Goal status: INITIAL  4.  Patient will improve bilateral lower extremity single-leg stance to 20 seconds or greater in order to demonstrate improvement in balance and stability. Baseline: 3 seconds on right lower extremity 15 seconds on left lower extremity Goal status:  INITIAL  5.  Patient will a send and descend 4 stairs without upper extremity assist in order to improve her safety with stair navigation within the community.  Baseline: Patient has loss of balance when descending stairs without upper extremity assist initial eval requiring min assist from therapist to prevent fall Goal status: INITIAL     ASSESSMENT:  CLINICAL IMPRESSION:  Patient presents with continued difficulty with dizziness.  Patient challenged with high-level balance on core balance machine showing difficulty with  anterior to posterior balance righting reaction strategies.  Patient further challenged with rocker board in anterior to posterior direction working on sagittal plane corrections with ankle strategies.  Patient also challenged with ball toss with vertical head nods.  Pt will continue to benefit from skilled therapy to address remaining deficits in order to improve overall QoL and return to PLOF.      OBJECTIVE IMPAIRMENTS: decreased activity tolerance, decreased balance, decreased mobility, and decreased strength.   ACTIVITY LIMITATIONS: standing, squatting, and stairs  PARTICIPATION LIMITATIONS: laundry, community activity, and yard work  PERSONAL FACTORS: Age and 3+ comorbidities: arthritis, cardiomyopathy, congestive heart failure, chronic fatigue syndrome, COPD, hyperlipidemia, hypertension, osteoporosis, TIA/mini strokes, and narcolepsy.  are also affecting patient's functional outcome.   REHAB POTENTIAL: Good  CLINICAL DECISION MAKING: Evolving/moderate complexity  EVALUATION COMPLEXITY: Moderate  PLAN:  PT FREQUENCY: 1-2x/week  PT DURATION: 8 weeks  PLANNED INTERVENTIONS: Therapeutic exercises, Therapeutic activity, Neuromuscular re-education, Balance training, Gait training, Patient/Family education, Self Care, Joint mobilization, and Stair training  PLAN FOR NEXT SESSION: Progress home exercise program. Continue with dynamic balance activities, dual  task balance, frontal plane hip strengthening and hip flexor strengthening.  Include ankle stability exercises during future sessions   Norman Herrlich PT ,DPT Physical Therapist- [facility]  [facility]   02/05/23, 1:57 PM

## 2023-02-05 NOTE — Telephone Encounter (Signed)
Patient is calling to scheduled a appointment that Dr. Judithann Fisher sent a referral for Mixed IBS symptoms. Patient states she would like to schedule a appointment with Dr. Servando Fisher. Informed patient we would have to get Dr. Servando Fisher approval because she is a establish patient with [facility] Dr. Norma Fisher office. Asked patient the reason why she is wanting to transfer she states she is dissatisfied with the care she is getting from from Dr. Norma Fisher and she does not feel like he is helping her symptoms. She only wants to come to our [place] location. Please advise if you are okay with the transfer she only wanted to see Dr. Servando Fisher no one else

## 2023-02-07 ENCOUNTER — Ambulatory Visit: Payer: Medicare PPO | Admitting: Physical Therapy

## 2023-02-07 DIAGNOSIS — R2689 Other abnormalities of gait and mobility: Secondary | ICD-10-CM

## 2023-02-07 DIAGNOSIS — M6281 Muscle weakness (generalized): Secondary | ICD-10-CM | POA: Diagnosis not present

## 2023-02-07 DIAGNOSIS — R269 Unspecified abnormalities of gait and mobility: Secondary | ICD-10-CM

## 2023-02-07 NOTE — Therapy (Unsigned)
OUTPATIENT PHYSICAL THERAPY NEURO TREATMENT   Patient Name: Marie Fisher MRN: [id number] DOB:1945-05-07, 77 y.o., female Today's Date: 02/08/2023   PCP: Marguarite Arbour, MD  REFERRING PROVIDER:  Morene Crocker, MD     END OF SESSION:  PT End of Session - 02/07/23 1619     Visit Number 6    Number of Visits 14    Date for PT Re-Evaluation 03/15/23    Progress Note Due on Visit 10    PT Start Time 1615    PT Stop Time 1657    PT Time Calculation (min) 42 min    Equipment Utilized During Treatment Gait belt    Activity Tolerance Patient tolerated treatment well               Past Medical History:  Diagnosis Date   Allergy    Anemia    Arthritis    Asthma    Cardiomyopathy (HCC)    CHF (congestive heart failure) (HCC)    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   Chronic fatigue syndrome    COPD (chronic obstructive pulmonary disease) (HCC)    Depression    Fibrocystic breast 08/10/2014   GERD (gastroesophageal reflux disease) 08/10/2014   Headache    Hyperlipidemia 08/10/2014   Hypertension    Narcolepsy and cataplexy    OAB (overactive bladder) 08/10/2014   Osteoporosis    Prolapse of female pelvic organs 08/10/2014   Stroke (HCC) 03/27/2008   mini   SVT (supraventricular tachycardia)    Urinary tract infection    recurrent   Vaginal atrophy 08/10/2014   Viral cardiomyopathy (HCC)    Past Surgical History:  Procedure Laterality Date   ABLATION N/A    Cardiac   BACK SURGERY     BLADDER SURGERY     BREAST BIOPSY Bilateral    x3   CARPAL TUNNEL RELEASE     bil.   CERVICAL SPINE SURGERY  1990   COLONOSCOPY WITH PROPOFOL N/A 02/02/2021   Procedure: COLONOSCOPY WITH PROPOFOL;  Surgeon: Toledo, Boykin Nearing, MD;  Location: [facility] ENDOSCOPY;  Service: Gastroenterology;  Laterality: N/A;   ESOPHAGOGASTRODUODENOSCOPY N/A 02/02/2021   Procedure: ESOPHAGOGASTRODUODENOSCOPY (EGD);  Surgeon: Toledo, Boykin Nearing, MD;  Location: [facility]  ENDOSCOPY;  Service: Gastroenterology;  Laterality: N/A;  PM arrival   ESOPHAGOGASTRODUODENOSCOPY (EGD) WITH PROPOFOL N/A 09/24/2015   Procedure: ESOPHAGOGASTRODUODENOSCOPY (EGD) WITH PROPOFOL;  Surgeon: Christena Deem, MD;  Location: [facility] ENDOSCOPY;  Service: Endoscopy;  Laterality: N/A;   NECK SURGERY     from front   tension free vaginal tape     TONSILLECTOMY     Patient Active Problem List   Diagnosis Date Noted   Rectocele 08/17/2016   Endocervical polyp 08/17/2016   History of TIA (transient ischemic attack) 06/28/2015   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) 06/28/2015   Shortness of breath on exertion 06/28/2015   Chest pain 06/17/2015   Acquired hypothyroidism 12/24/2014   Essential (primary) hypertension 10/12/2014   Urinary frequency 09/05/2014   Absolute anemia 08/27/2014   BP (high blood pressure) 08/27/2014   Hyperlipidemia 08/10/2014   GERD (gastroesophageal reflux disease) 08/10/2014   Fibrocystic breast 08/10/2014   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   Vaginal atrophy 08/10/2014   Prolapse of female pelvic organs 08/10/2014   OAB (overactive bladder) 08/10/2014   Urinary tract infection    Mitral valve prolapse 12/26/2012   Systolic dysfunction 12/26/2012   Chronic combined systolic and diastolic  heart failure (HCC) 12/26/2012   Supraventricular tachycardia (HCC) 10/07/2012    ONSET DATE: R26.89 (ICD-10-CM) - Imbalance   REFERRING DIAG: R26.89 (ICD-10-CM) - Imbalance   THERAPY DIAG:  Muscle weakness (generalized)  Other abnormalities of gait and mobility  Abnormality of gait and mobility  Rationale for Evaluation and Treatment: Rehabilitation  SUBJECTIVE:                                                                                                                                                                                             SUBJECTIVE STATEMENT:  Patient reports no significant changes  since previous session.  She had 1 mild episode of dizziness this morning that resolved relatively quickly.  Pt accompanied by: self  PERTINENT HISTORY: For the past 4 months pt has not been well. Pt has diverticulitis and IBS. Pt has been to many MDs and the the neurologist referred her here for her balance. The past few weeks she has been dizzy a lot. She reports her dizziness feels unsteady and feels she could pass out at times. Pt sees no correlation with positional changes and her dizziness. Pt reports in the past 4 months it has progressively gotten worse. No changes in medication as of recently, added an antibiotic for UTI about 4 weeks ago but did not notice any changes in balance with this.  Patient has no falls or loss of balance as related to her reported dizziness just periods of unsteadiness where she feels like she could fall.  Patient has history of arthritis, cardiomyopathy, congestive heart failure, chronic fatigue syndrome, COPD, hyperlipidemia, hypertension, osteoporosis, TIA/mini strokes, and narcolepsy.  PAIN:  Are you having pain? No  PRECAUTIONS: None  RED FLAGS: None   WEIGHT BEARING RESTRICTIONS: No  FALLS: Has patient fallen in last 6 months? Yes. Number of falls 1 when she tripped over something. No falls related to the dizziness.   LIVING ENVIRONMENT: Lives with: lives alone Lives in: House/apartment Stairs: Yes: Internal: 13 steps; but doesn't use them regularly Has following equipment at home: None  PLOF: Independent  PATIENT GOALS: Improve balance and be able to walk better.   OBJECTIVE:   DIAGNOSTIC FINDINGS: No evidence of acute intracranial abnormality or reversible cause of memory loss.  COGNITION: Overall cognitive status: Within functional limits for tasks assessed   SENSATION: Not tested     POSTURE: No Significant postural limitations  LOWER EXTREMITY MMT:     MMT  Right Eval Left Eval  Hip flexion 4 4  Hip extension    Hip  abduction 4 4  Hip adduction 5 5  Hip internal rotation  Hip external rotation    Knee flexion 4+ 4+  Knee extension 4+ 4+  Ankle dorsiflexion 5 5  Ankle plantarflexion 5 5  Ankle inversion    Ankle eversion     (Blank rows = not tested)  BED MOBILITY:  No problems   TRANSFERS:no problem with STS transfers, difficulty getting up form a chair in the past few weeks.   Floor:  test at future date   RAMP:  Level of Assistance: Complete Independence Assistive device utilized: None Ramp Comments: pt reports no issues with ramps   CURB:  Level of Assistance:  pt has near LOB when descending steps for curb simulation without UE assist.  Assistive device utilized: None Curb Comments: min A to prevent LOB  STAIRS: Level of Assistance: Min A Stair Negotiation Technique: Alternating Pattern  with No Rails Number of Stairs: 4  Height of Stairs: 6 in  Comments: ascending no LOB, descending required min A and UE assist to prevent LOB, did not clear step at bottom properly.    FUNCTIONAL TESTS:  Five times Sit to Stand Test (FTSS)  TIME: 8 sec  Cut off scores indicative of increased fall risk: >12 sec CVA, >16 sec PD, >13 sec vestibular (ANPTA Core Set of Outcome Measures for Adults with Neurologic Conditions, 2018)  Pt scores 22 / 28 on mini BEST balance test. Scores < 16 indicate increased risk for falls Para March, [place], & [place]) and MCID is 4 (Godi,et al, 2013)   8.3 sec 10 MWT  PATIENT SURVEYS:  FOTO 57  TODAY'S TREATMENT:  Unless otherwise stated, CGA was provided and gait belt donned in order to ensure pt safety  Physical therapy treatment session today consisted of completing assessment of goals and administration of testing as demonstrated and documented in flow sheet, treatment, and goals section of this note. Addition treatments may be found below.    NMR Kore balance trainer working on patients proprioception and standing balance on dynamic surface  Setting  type(s): tux racer ( b[place] hill x 2, in search of vdk x 1)  Reps: 3 Comments: difficulty with forward and post weight shift     [facility] PT Assessment - 02/07/23 0001       Mini-BESTest   Sit To Stand Normal: Comes to stand without use of hands and stabilizes independently.    Rise to Toes Normal: Stable for 3 s with maximum height.    Stand on one leg (left) Normal: 20 s.    Stand on one leg (right) Normal: 20 s.    Stand on one leg - lowest score 2    Compensatory Stepping Correction - Forward Normal: Recovers independently with a single, large step (second realignement is allowed).    Compensatory Stepping Correction - Backward Normal: Recovers independently with a single, large step    Compensatory Stepping Correction - Left Lateral Moderate: Several steps to recover equilibrium    Compensatory Stepping Correction - Right Lateral Moderate: Several steps to recover equilibrium    Stepping Corredtion Lateral - lowest score 1    Stance - Feet together, eyes open, firm surface  Normal: 30s    Stance - Feet together, eyes closed, foam surface  Normal: 30s    Incline - Eyes Closed Normal: Stands independently 30s and aligns with gravity    Change in Gait Speed Normal: Significantly changes walkling speed without imbalance    Walk with head turns - Horizontal Moderate: performs head turns with reduction in gait speed.  Walk with pivot turns Normal: Turns with feet close FAST (< 3 steps) with good balance.    Step over obstacles Normal: Able to step over box with minimal change of gait speed and with good balance.    Timed UP & GO with Dual Task Normal: No noticeable change in sitting, standing or walking while backward counting when compared to TUG without   12.9 normal TUG 13 sec dual task tug   Mini-BEST total score 26              PATIENT EDUCATION: Education details: POC Person educated: Patient Education method: Explanation Education comprehension: verbalized  understanding  HOME EXERCISE PROGRAM:  Access Code: [email or web address] URL: [email or web address] Date: 12/06/2022 Prepared by: Maureen Ralphs  Exercises - Seated Gaze Stabilization with Head Rotation  - 3 x weekly - 3 sets - 10 reps - Seated Horizontal Smooth Pursuit  - 3 x weekly - 3 sets - 10 reps - Tandem Stance  - 3 x weekly - 3 sets - 10 reps - Single Leg Stance  - 3 x weekly - 3-4 sets - 10-20 hold   GOALS: Goals reviewed with patient? Yes  SHORT TERM GOALS: Target date: 01/01/2023   Patient will be independent in home exercise program to improve strength/mobility for better functional independence with ADLs. Baseline: No HEP currently 02/07/23: Was doing in October but not much since due to dizziness  Goal status: INITIAL   LONG TERM GOALS: Target date: 01/29/2023   1.  Patient will increase FOTO score to equal to or greater than  64   to demonstrate statistically significant improvement in mobility and quality of life.  Baseline: 57 11/13: 56 Goal status: ONGOING   2.  Patient will increase Mini BEST Balance score by > 4 points to demonstrate decreased fall risk during functional activities. Baseline: 22 Goal status: MET   3.  Patient will reduce timed up and go with dual task to <11 seconds to reduce fall risk and demonstrate improved transfer/gait ability. Baseline: 13 sec 11/13: 13 sec (same as dual task) Goal status: ONGOING  4.  Patient will improve bilateral lower extremity single-leg stance to 20 seconds or greater in order to demonstrate improvement in balance and stability. Baseline: 3 seconds on right lower extremity 15 seconds on left lower extremity 11/13: 20 sec on ea LE on second attempt  Goal status: MET  5.  Patient will ascend and descend 4 stairs without upper extremity assist in order to improve her safety with stair navigation within the community.  Baseline: Patient has loss of balance when descending stairs without upper extremity  assist initial eval requiring min assist from therapist to prevent fall 11/13: Able to complete without LOB  Goal status: MET     ASSESSMENT:  CLINICAL IMPRESSION:  Patient presents to physical therapy for assessment of goals this date.  Patient shows progress towards all of her goals as far as improving her balance and reducing her risk of falls as evidenced by improvement in many best balance test score, improvement in single-leg stance time, and overall improved confidence with balance.  Patient is still having intermittent spells of dizziness that caused her significant impairment.  Physical therapist will be adjusting patient's schedule in order to allow her to see our vestibular specialist to determine if any further treatment can be rendered to improve these spells of dizziness. Pt will continue to benefit from skilled physical therapy intervention to address impairments, improve QOL, and attain therapy goals.  OBJECTIVE IMPAIRMENTS: decreased activity tolerance, decreased balance, decreased mobility, and decreased strength.   ACTIVITY LIMITATIONS: standing, squatting, and stairs  PARTICIPATION LIMITATIONS: laundry, community activity, and yard work  PERSONAL FACTORS: Age and 3+ comorbidities: arthritis, cardiomyopathy, congestive heart failure, chronic fatigue syndrome, COPD, hyperlipidemia, hypertension, osteoporosis, TIA/mini strokes, and narcolepsy.  are also affecting patient's functional outcome.   REHAB POTENTIAL: Good  CLINICAL DECISION MAKING: Evolving/moderate complexity  EVALUATION COMPLEXITY: Moderate  PLAN:  PT FREQUENCY: 1-2x/week  PT DURATION: 8 weeks  PLANNED INTERVENTIONS: Therapeutic exercises, Therapeutic activity, Neuromuscular re-education, Balance training, Gait training, Patient/Family education, Self Care, Joint mobilization, and Stair training  PLAN FOR NEXT SESSION: Vestibular assessment with Temple Pacini PT, DPT  Norman Herrlich PT  ,DPT Physical Therapist- [facility]   02/08/23, 7:40 AM

## 2023-02-08 NOTE — Addendum Note (Signed)
Addended by: Thresa Ross B on: 02/08/2023 07:39 AM   Modules accepted: Orders

## 2023-02-12 ENCOUNTER — Ambulatory Visit: Payer: Medicare PPO | Admitting: Physical Therapy

## 2023-02-14 ENCOUNTER — Ambulatory Visit: Payer: Medicare PPO | Admitting: Physical Therapy

## 2023-02-14 NOTE — Therapy (Deleted)
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   Chronic fatigue syndrome    COPD (chronic obstructive pulmonary disease) (HCC)    Depression    Fibrocystic breast 08/10/2014   GERD (gastroesophageal reflux disease) 08/10/2014   Headache    Hyperlipidemia 08/10/2014   Hypertension    Narcolepsy and cataplexy    OAB (overactive bladder) 08/10/2014   Osteoporosis    Prolapse of female pelvic organs 08/10/2014   Stroke (HCC) 03/27/2008   mini   SVT (supraventricular tachycardia)    Urinary tract infection    recurrent   Vaginal atrophy 08/10/2014   Viral cardiomyopathy (HCC)    Past Surgical History:  Procedure Laterality Date   ABLATION N/A    Cardiac   BACK SURGERY     BLADDER SURGERY     BREAST BIOPSY Bilateral    x3   CARPAL TUNNEL RELEASE     bil.   CERVICAL SPINE SURGERY  1990   COLONOSCOPY WITH PROPOFOL N/A 02/02/2021   Procedure: COLONOSCOPY WITH PROPOFOL;  Surgeon: Toledo, Boykin Nearing, MD;  Location: [facility] ENDOSCOPY;  Service: Gastroenterology;  Laterality: N/A;   ESOPHAGOGASTRODUODENOSCOPY N/A 02/02/2021   Procedure: ESOPHAGOGASTRODUODENOSCOPY (EGD);  Surgeon: Toledo, Boykin Nearing, MD;  Location: [facility] ENDOSCOPY;  Service: Gastroenterology;  Laterality: N/A;  PM arrival   ESOPHAGOGASTRODUODENOSCOPY (EGD) WITH PROPOFOL N/A 09/24/2015   Procedure: ESOPHAGOGASTRODUODENOSCOPY (EGD) WITH PROPOFOL;  Surgeon: Christena Deem, MD;  Location[facility] ENDOSCOPY;  Service: Endoscopy;  Laterality: N/A;   NECK SURGERY     from front   tension free vaginal tape      TONSILLECTOMY     Patient Active Problem List   Diagnosis Date Noted   Rectocele 08/17/2016   Endocervical polyp 08/17/2016   History of TIA (transient ischemic attack) 06/28/2015   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) 06/28/2015   Shortness of breath on exertion 06/28/2015   Chest pain 06/17/2015   Acquired hypothyroidism 12/24/2014   Essential (primary) hypertension 10/12/2014   Urinary frequency 09/05/2014   Absolute anemia 08/27/2014   BP (high blood pressure) 08/27/2014   Hyperlipidemia 08/10/2014   GERD (gastroesophageal reflux disease) 08/10/2014   Fibrocystic breast 08/10/2014   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   Vaginal atrophy 08/10/2014   Prolapse of female pelvic organs 08/10/2014   OAB (overactive bladder) 08/10/2014   Urinary tract infection    Mitral valve prolapse 12/26/2012   Systolic dysfunction 12/26/2012   Chronic combined systolic and diastolic heart failure (HCC) 12/26/2012   Supraventricular tachycardia (HCC) 10/07/2012    ONSET DATE: R26.89 (ICD-10-CM) - Imbalance   REFERRING DIAG: R26.89 (ICD-10-CM) - Imbalance   THERAPY DIAG:  Muscle weakness (generalized)  Other abnormalities of gait and mobility  Abnormality of gait and mobility  Rationale for Evaluation and Treatment: Rehabilitation  SUBJECTIVE:  SUBJECTIVE STATEMENT:  Patient reports no significant changes since previous session.  She had 1 mild episode of dizziness this morning that resolved relatively quickly.  Pt accompanied by: self  PERTINENT HISTORY: For the past 4 months pt has not been well. Pt has diverticulitis and IBS. Pt has been to many MDs and the the neurologist referred her here for her balance. The past few weeks she has been dizzy a lot.  She reports her dizziness feels unsteady and feels she could pass out at times. Pt sees no correlation with positional changes and her dizziness. Pt reports in the past 4 months it has progressively gotten worse. No changes in medication as of recently, added an antibiotic for UTI about 4 weeks ago but did not notice any changes in balance with this.  Patient has no falls or loss of balance as related to her reported dizziness just periods of unsteadiness where she feels like she could fall.  Patient has history of arthritis, cardiomyopathy, congestive heart failure, chronic fatigue syndrome, COPD, hyperlipidemia, hypertension, osteoporosis, TIA/mini strokes, and narcolepsy.  PAIN:  Are you having pain? No  PRECAUTIONS: None  RED FLAGS: None   WEIGHT BEARING RESTRICTIONS: No  FALLS: Has patient fallen in last 6 months? Yes. Number of falls 1 when she tripped over something. No falls related to the dizziness.   LIVING ENVIRONMENT: Lives with: lives alone Lives in: House/apartment Stairs: Yes: Internal: 13 steps; but doesn't use them regularly Has following equipment at home: None  PLOF: Independent  PATIENT GOALS: Improve balance and be able to walk better.   OBJECTIVE:   DIAGNOSTIC FINDINGS: No evidence of acute intracranial abnormality or reversible cause of memory loss.  COGNITION: Overall cognitive status: Within functional limits for tasks assessed   SENSATION: Not tested     POSTURE: No Significant postural limitations  LOWER EXTREMITY MMT:     MMT  Right Eval Left Eval  Hip flexion 4 4  Hip extension    Hip abduction 4 4  Hip adduction 5 5  Hip internal rotation    Hip external rotation    Knee flexion 4+ 4+  Knee extension 4+ 4+  Ankle dorsiflexion 5 5  Ankle plantarflexion 5 5  Ankle inversion    Ankle eversion     (Blank rows = not tested)  BED MOBILITY:  No problems   TRANSFERS:no problem with STS transfers, difficulty getting up form a chair in  the past few weeks.   Floor:  test at future date   RAMP:  Level of Assistance: Complete Independence Assistive device utilized: None Ramp Comments: pt reports no issues with ramps   CURB:  Level of Assistance:  pt has near LOB when descending steps for curb simulation without UE assist.  Assistive device utilized: None Curb Comments: min A to prevent LOB  STAIRS: Level of Assistance: Min A Stair Negotiation Technique: Alternating Pattern  with No Rails Number of Stairs: 4  Height of Stairs: 6 in  Comments: ascending no LOB, descending required min A and UE assist to prevent LOB, did not clear step at bottom properly.    FUNCTIONAL TESTS:  Five times Sit to Stand Test (FTSS)  TIME: 8 sec  Cut off scores indicative of increased fall risk: >12 sec CVA, >16 sec PD, >13 sec vestibular (ANPTA Core Set of Outcome Measures for Adults with Neurologic Conditions, 2018)  Pt scores 22 / 28 on mini BEST balance test. Scores < 16 indicate increased risk for falls Para March,  [place], & [place]) and MCID is 4 (Godi,et al, 2013)   8.3 sec 10 MWT  PATIENT SURVEYS:  FOTO 57  TODAY'S TREATMENT:  Dual task balance  - gaze stabilization exercises  NMR [place] balance trainer working on patients proprioception and standing balance on dynamic surface  Setting type(s): tux racer ( [place] x 2, in search of vdk x 1)  Reps: 3 Comments: difficulty with forward and post weight shift        PATIENT EDUCATION: Education details: POC Person educated: Patient Education method: Explanation Education comprehension: verbalized understanding  HOME EXERCISE PROGRAM:  Access Code: [email or web address] URL: [email or web address] Date: 12/06/2022 Prepared by: Maureen Ralphs  Exercises - Seated Gaze Stabilization with Head Rotation  - 3 x weekly - 3 sets - 10 reps - Seated Horizontal Smooth Pursuit  - 3 x weekly - 3 sets - 10 reps - Tandem Stance  - 3 x weekly - 3 sets - 10 reps -  Single Leg Stance  - 3 x weekly - 3-4 sets - 10-20 hold   GOALS: Goals reviewed with patient? Yes  SHORT TERM GOALS: Target date: 01/01/2023   Patient will be independent in home exercise program to improve strength/mobility for better functional independence with ADLs. Baseline: No HEP currently 02/07/23: Was doing in October but not much since due to dizziness  Goal status: INITIAL   LONG TERM GOALS: Target date: 01/29/2023   1.  Patient will increase FOTO score to equal to or greater than  64   to demonstrate statistically significant improvement in mobility and quality of life.  Baseline: 57 11/13: 56 Goal status: ONGOING   2.  Patient will increase Mini BEST Balance score by > 4 points to demonstrate decreased fall risk during functional activities. Baseline: 22 Goal status: MET   3.  Patient will reduce timed up and go with dual task to <11 seconds to reduce fall risk and demonstrate improved transfer/gait ability. Baseline: 13 sec 11/13: 13 sec (same as dual task) Goal status: ONGOING  4.  Patient will improve bilateral lower extremity single-leg stance to 20 seconds or greater in order to demonstrate improvement in balance and stability. Baseline: 3 seconds on right lower extremity 15 seconds on left lower extremity 11/13: 20 sec on ea LE on second attempt  Goal status: MET  5.  Patient will ascend and descend 4 stairs without upper extremity assist in order to improve her safety with stair navigation within the community.  Baseline: Patient has loss of balance when descending stairs without upper extremity assist initial eval requiring min assist from therapist to prevent fall 11/13: Able to complete without LOB  Goal status: MET     ASSESSMENT:  CLINICAL IMPRESSION:  Pt presents with good motivation for completion of PT activities.      OBJECTIVE IMPAIRMENTS: decreased activity tolerance, decreased balance, decreased mobility, and decreased strength.    ACTIVITY LIMITATIONS: standing, squatting, and stairs  PARTICIPATION LIMITATIONS: laundry, community activity, and yard work  PERSONAL FACTORS: Age and 3+ comorbidities: arthritis, cardiomyopathy, congestive heart failure, chronic fatigue syndrome, COPD, hyperlipidemia, hypertension, osteoporosis, TIA/mini strokes, and narcolepsy.  are also affecting patient's functional outcome.   REHAB POTENTIAL: Good  CLINICAL DECISION MAKING: Evolving/moderate complexity  EVALUATION COMPLEXITY: Moderate  PLAN:  PT FREQUENCY: 1-2x/week  PT DURATION: 8 weeks  PLANNED INTERVENTIONS: Therapeutic exercises, Therapeutic activity, Neuromuscular re-education, Balance training, Gait training, Patient/Family education, Self Care, Joint mobilization, and Stair training  PLAN FOR NEXT SESSION: Vestibular assessment with Temple Pacini  PT, DPT  Norman Herrlich PT ,DPT Physical Therapist- [facility]   02/14/23, 9:08 AM

## 2023-02-19 ENCOUNTER — Ambulatory Visit: Payer: Medicare PPO

## 2023-02-20 ENCOUNTER — Ambulatory Visit: Payer: Medicare PPO | Admitting: Occupational Therapy

## 2023-02-20 ENCOUNTER — Encounter: Payer: Self-pay | Admitting: Occupational Therapy

## 2023-02-20 DIAGNOSIS — M6281 Muscle weakness (generalized): Secondary | ICD-10-CM | POA: Diagnosis not present

## 2023-02-20 DIAGNOSIS — M25542 Pain in joints of left hand: Secondary | ICD-10-CM

## 2023-02-20 NOTE — Therapy (Addendum)
OUTPATIENT OCCUPATIONAL THERAPY ORTHO EVALUATION  Patient Name: Marie Fisher MRN: [id number] DOB:[birth year], 77 y.o., female Today's Date: 02/20/2023  PCP: Dr Judithann Sheen REFERRING PROVIDER: Dr Rosita Kea  END OF SESSION:  OT End of Session - 02/20/23 1442     Visit Number 1    Number of Visits 6    Date for OT Re-Evaluation 04/17/23    OT Start Time 1408    OT Stop Time 1445    OT Time Calculation (min) 37 min    Activity Tolerance Patient tolerated treatment well    Behavior During Therapy [facility] for tasks assessed/performed             Past Medical History:  Diagnosis Date   Allergy    Anemia    Arthritis    Asthma    Cardiomyopathy (HCC)    CHF (congestive heart failure) (HCC)    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   Chronic fatigue syndrome    COPD (chronic obstructive pulmonary disease) (HCC)    Depression    Fibrocystic breast 08/10/2014   GERD (gastroesophageal reflux disease) 08/10/2014   Headache    Hyperlipidemia 08/10/2014   Hypertension    Narcolepsy and cataplexy    OAB (overactive bladder) 08/10/2014   Osteoporosis    Prolapse of female pelvic organs 08/10/2014   Stroke (HCC) 03/27/2008   mini   SVT (supraventricular tachycardia) (HCC)    Urinary tract infection    recurrent   Vaginal atrophy 08/10/2014   Viral cardiomyopathy (HCC)    Past Surgical History:  Procedure Laterality Date   ABLATION N/A    Cardiac   BACK SURGERY     BLADDER SURGERY     BREAST BIOPSY Bilateral    x3   CARPAL TUNNEL RELEASE     bil.   CERVICAL SPINE SURGERY  1990   COLONOSCOPY WITH PROPOFOL N/A 02/02/2021   Procedure: COLONOSCOPY WITH PROPOFOL;  Surgeon: Toledo, Boykin Nearing, MD;  Location: [facility] ENDOSCOPY;  Service: Gastroenterology;  Laterality: N/A;   ESOPHAGOGASTRODUODENOSCOPY N/A 02/02/2021   Procedure: ESOPHAGOGASTRODUODENOSCOPY (EGD);  Surgeon: Toledo, Boykin Nearing, MD;  Location: [facility] ENDOSCOPY;  Service: Gastroenterology;  Laterality: N/A;  PM  arrival   ESOPHAGOGASTRODUODENOSCOPY (EGD) WITH PROPOFOL N/A 09/24/2015   Procedure: ESOPHAGOGASTRODUODENOSCOPY (EGD) WITH PROPOFOL;  Surgeon: Christena Deem, MD;  Location: [facility] ENDOSCOPY;  Service: Endoscopy;  Laterality: N/A;   NECK SURGERY     from front   tension free vaginal tape     TONSILLECTOMY     Patient Active Problem List   Diagnosis Date Noted   Rectocele 08/17/2016   Endocervical polyp 08/17/2016   History of TIA (transient ischemic attack) 06/28/2015   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) 06/28/2015   Shortness of breath on exertion 06/28/2015   Chest pain 06/17/2015   Acquired hypothyroidism 12/24/2014   Essential (primary) hypertension 10/12/2014   Urinary frequency 09/05/2014   Absolute anemia 08/27/2014   BP (high blood pressure) 08/27/2014   Hyperlipidemia 08/10/2014   GERD (gastroesophageal reflux disease) 08/10/2014   Fibrocystic breast 08/10/2014   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   Vaginal atrophy 08/10/2014   Prolapse of female pelvic organs 08/10/2014   OAB (overactive bladder) 08/10/2014   Urinary tract infection    Mitral valve prolapse 12/26/2012   Systolic dysfunction 12/26/2012   Chronic combined systolic and diastolic heart failure (HCC) 12/26/2012   Supraventricular tachycardia (HCC) 10/07/2012    ONSET DATE: Early Sept  REFERRING DIAG:  Nontraumatic ext tendon subluxation, Joint pain and tendon in L hand  THERAPY DIAG:  Pain in joint of left hand  Rationale for Evaluation and Treatment: Rehabilitation  SUBJECTIVE:   SUBJECTIVE STATEMENT: I hurt my finger I think when I was taking a wheelchair ever my car about 2-1/2 months ago -some days I feel like is getting better but then gets worse again.  If I use it a lot it hurts.  Is about a 7/10 at the moment but can go more than that. Pt accompanied by: self  PERTINENT HISTORY: 02/07/23 DR Rosita Kea note: Marie Fisher is a 77 y.o. female  here today.   The patient is a 77 year old female who comes in with left hand pain.  She reports experiencing pain on the dorsum of her left index and middle fingers, which began approximately 6 weeks ago while pulling a wheelchair out of a car. The pain does not originate from the wrist and is confined to the hand. She also notes swelling and has observed knots from arthritis in her hand joints, which are not painful. Rest provides temporary relief, but the pain returns with use. She occasionally takes anti-inflammatory medications and takes aspirin 81 mg daily. The patient takes boron daily which improves her symptoms.   X-rays of the left hand were ordered and interpreted today. She has not undergone any hand surgery or been tested for rheumatoid arthritis recently. She has not been tested for rheumatoid arthritis.   Her primary care physician is Dr. Aram Beecham. Her lifestyle has been largely sedentary due to severe fatigue, severe dizziness, IBS, and diverticulitis which has improved. She also has heart issues and Epstein-Barr virus.  The patient lives in [place]. She is left-hand dominant.   Refer to OT - splint fabrication too   PRECAUTIONS: None     WEIGHT BEARING RESTRICTIONS: No  PAIN:  Are you having pain? No  FALLS: Has patient fallen in last 6 months? No  LIVING ENVIRONMENT: Lives with: lives alone  PLOF: Patient likes to shop and read and watch TV.  Spent time with family.  Had normal range of motion with no pain prior to the onset  PATIENT GOALS: Want the pain better that I can use my left hand like I used to.   OBJECTIVE:  Note: Objective measures were completed at Evaluation unless otherwise noted.  HAND DOMINANCE: Left    UPPER EXTREMITY ROM:   Wrist AROM WNL and pain free  Active ROM Right eval Left eval  Thumb MCP (0-60)    Thumb IP (0-80)    Thumb Radial abd/add (0-55)     Thumb Palmar abd/add (0-45)     Thumb Opposition to Small Finger   Opposition WFL    Index MCP (0-90)  85  Index PIP (0-100)   100  Index DIP (0-70)      Long MCP (0-90)   95   Long PIP (0-100)    95  Long DIP (0-70)      Ring MCP (0-90)    95  Ring PIP (0-100)    95  Ring DIP (0-70)      Little MCP (0-90)    95  Little PIP (0-100)    95  Little DIP (0-70)      (Blank rows = not tested)    HAND FUNCTION: NT - pain 7/10 with fisting of 2nd digit- increase with use to 10/10   COORDINATION: Decrease because of pain   SENSATION: Denies any sensory  issues  EDEMA: No edema observed  COGNITION: Overall cognitive status: Within functional limits for tasks assessed      TODAY'S TREATMENT:                                                                                                                              DATE: 02/20/23 Fabricated a MC block splint for left second digit to wear to prevent composite flexion or fisting of second digit. Patient to wear it during nighttime as well as ADLs to avoid tight grip. Patient had no pain with PIP DIP flexion.  Only with composite. Reviewed also with patient and handout on joint protection and modifications to avoid composite flexion to decrease pain. Patient to do contrast 3 times a day to decrease inflammation and pain.   PATIENT EDUCATION: Education details: findings of eval and HEP  Person educated: Patient Education method: Explanation, Demonstration, Tactile cues, Verbal cues, and Handouts Education comprehension: verbalized understanding, returned demonstration, verbal cues required, and needs further education   GOALS: Goals reviewed with patient? Yes  LONG TERM GOALS: Target date: 8 wks  Pain decreased with composite flexion of left second digit to less than 3/10 with home program and splint wearing Baseline: Pain with flexion of left hand and second digit 7-10/10; no splint and no knowledge of home program Goal status: INITIAL  2.  Patient verbalize and demo 3 modifications or  joint protection to decrease pain to 3/10 at the worst. Baseline: No knowledge pain increases 7-10/10 with use of left hand Goal status: INITIAL  3.  Patient able to make a composite fist-flexion of second digit with pain less than a 1/10 to be able to wean out of splint Baseline: Splint most of the time for composite flexion activities as well as nighttime.  7-10/10 pain with functional use of flexion Goal status: INITIAL  4.  Grip and prehension strength in left hand compared to right increased to within functional limits symptom-free. Baseline: Not tested patient's pain 7-10/10 with functional use or flexion. ASSESSMENT:  CLINICAL IMPRESSION: Patient seen today for occupational therapy evaluation for injury to left dominant hand second digit extensor tendon.  Pain over second metacarpal and extensor.  Pain mostly increases with use of functional hand during composite flexion and extension.  Pain coming and 7/10 but can increase to 10/10 with functional use.  Active range of motion within normal limits compared to the right hand.  Strength was not assessed.  Fabricated a MC block splint for patient to use during functional activities that involve composite fisting to avoid.  As well as education for joint protection and modification to decrease pain while maintaining active range of motion.  Patient can benefit from skilled OT services to decrease pain and edema and increase motion and strength to return to prior level of function.  PERFORMANCE DEFICITS: in functional skills including ADLs, IADLs, ROM, strength, pain, flexibility, decreased knowledge of use of DME, and UE functional use,  and psychosocial skills including environmental adaptation and routines and behaviors.   IMPAIRMENTS: are limiting patient from ADLs, IADLs, rest and sleep, play, leisure, and social participation.   COMORBIDITIES: has no other co-morbidities that affects occupational performance. Patient will benefit from  skilled OT to address above impairments and improve overall function.  MODIFICATION OR ASSISTANCE TO COMPLETE EVALUATION: No modification of tasks or assist necessary to complete an evaluation.  OT OCCUPATIONAL PROFILE AND HISTORY: Problem focused assessment: Including review of records relating to presenting problem.  CLINICAL DECISION MAKING: LOW - limited treatment options, no task modification necessary  REHAB POTENTIAL: Good for goals  EVALUATION COMPLEXITY: Low  GOALS: Goals reviewed with patient? Yes     LONG TERM GOALS: Target date: 6 wks   Patient independent in home program to improve composite flexion and extension of wrist range of motion with extended arm to within normal limits in bilateral upper extremities. Baseline: Patient with increased stiffness and pain with attempts of composite flexion and extension of wrist with extended elbow on right and left with the right worse than left Goal status: INITIAL   2.  Patient digit flexion extension improved to within normal limits including hyperextension of digits 2 reported decrease of symptoms in the morning as well as with use at work. Baseline: Flexion within normal limits but unable to extend digits fully.  Increased tightness and stiffness in the hands. Goal status: INITIAL   3.  Patient demo verbalized 3 joint protection and modifications to decrease symptoms of carpal tunnel and bilateral hands. Baseline: No knowledge of modifications or joint protection.  Patient has positive Tinel on the left carpal tunnel. Goal status: INITIAL     ASSESSMENT:   CLINICAL IMPRESSION: Patient seen today for occupational therapy evaluation for left hand increased pain and weakness.  Patient reports symptoms started about beginning of November.  Left hand worse than right.  Patient report feeling of numbness in the morning on the left at times with increased tired and weak feeling in bilateral hands during the day.  Patient with  increased tightness in forearm flexors and extensors with the right worse than the left.  Patient with increased stiffness tightness in bilateral hands limiting composite digit extension.  Patient with positive Tinel over left carpal tunnel.  With reports of increased discomfort, pins and needle and weakness in bilateral hands limiting his functional use at work as well as at home.  As also symptoms at nighttime.  Patient can benefit from skilled OT services to decrease stiffness and tightness increase motion and discomfort and pain to return to prior level of function.   PERFORMANCE DEFICITS: in functional skills including ADLs, IADLs, ROM, strength, pain, flexibility, decreased knowledge of use of DME, and UE functional use,   and psychosocial skills including environmental adaptation and routines and behaviors.    IMPAIRMENTS: are limiting patient from ADLs, IADLs, rest and sleep, play, leisure, and social participation.    COMORBIDITIES: has no other co-morbidities that affects occupational performance. Patient will benefit from skilled OT to address above impairments and improve overall function.   MODIFICATION OR ASSISTANCE TO COMPLETE EVALUATION: No modification of tasks or assist necessary to complete an evaluation.   OT OCCUPATIONAL PROFILE AND HISTORY: Problem focused assessment: Including review of records relating to presenting problem.   CLINICAL DECISION MAKING: LOW - limited treatment options, no task modification necessary   REHAB POTENTIAL: Good for goals   EVALUATION COMPLEXITY: Low     PLAN:   OT FREQUENCY: 1x/week  OT DURATION: 8  weeks PLANNING: Splinting;Paraffin;Fluidtherapy;Contrast Bath;DME and/or AE instruction;Manual Therapy;Passive range of motion;Therapeutic activities, Ultrasound, Iontophoresis, There ex, pt education CONSULTED AND AGREED WITH PLAN OF CARE: Patient         Oletta Cohn, OTR/L,CLT 02/20/2023, 3:58 PM    CONSULTED AND AGREED WITH  PLAN OF CARE: Patient     Oletta Cohn, OTR/L,CLT 02/20/2023, 3:38 PM

## 2023-02-21 ENCOUNTER — Ambulatory Visit: Payer: Medicare PPO

## 2023-02-26 ENCOUNTER — Ambulatory Visit: Payer: Medicare PPO

## 2023-02-28 ENCOUNTER — Ambulatory Visit: Payer: Medicare PPO | Admitting: Physical Therapy

## 2023-03-05 ENCOUNTER — Ambulatory Visit: Payer: Medicare PPO | Attending: Neurology

## 2023-03-05 DIAGNOSIS — R42 Dizziness and giddiness: Secondary | ICD-10-CM | POA: Diagnosis present

## 2023-03-05 DIAGNOSIS — R2681 Unsteadiness on feet: Secondary | ICD-10-CM | POA: Diagnosis present

## 2023-03-05 NOTE — Therapy (Signed)
OUTPATIENT PHYSICAL THERAPY NEURO TREATMENT   Patient Name: Marie Fisher MRN: [id number] DOB:[birth year], 77 y.o., female Today's Date: 03/05/2023   PCP: Marguarite Arbour, MD  REFERRING PROVIDER:  Morene Crocker, MD     END OF SESSION:  PT End of Session - 03/05/23 1714     Visit Number 7    Number of Visits 14    Date for PT Re-Evaluation 03/15/23    Progress Note Due on Visit 10    PT Start Time 1621    PT Stop Time 1701    PT Time Calculation (min) 40 min    Equipment Utilized During Treatment Gait belt    Activity Tolerance Patient tolerated treatment well    Behavior During Therapy WFL for tasks assessed/performed                Past Medical History:  Diagnosis Date   Allergy    Anemia    Arthritis    Asthma    Cardiomyopathy (HCC)    CHF (congestive heart failure) (HCC)    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   Chronic fatigue syndrome    COPD (chronic obstructive pulmonary disease) (HCC)    Depression    Fibrocystic breast 08/10/2014   GERD (gastroesophageal reflux disease) 08/10/2014   Headache    Hyperlipidemia 08/10/2014   Hypertension    Narcolepsy and cataplexy    OAB (overactive bladder) 08/10/2014   Osteoporosis    Prolapse of female pelvic organs 08/10/2014   Stroke (HCC) 03/27/2008   mini   SVT (supraventricular tachycardia) (HCC)    Urinary tract infection    recurrent   Vaginal atrophy 08/10/2014   Viral cardiomyopathy (HCC)    Past Surgical History:  Procedure Laterality Date   ABLATION N/A    Cardiac   BACK SURGERY     BLADDER SURGERY     BREAST BIOPSY Bilateral    x3   CARPAL TUNNEL RELEASE     bil.   CERVICAL SPINE SURGERY  1990   COLONOSCOPY WITH PROPOFOL N/A 02/02/2021   Procedure: COLONOSCOPY WITH PROPOFOL;  Surgeon: Toledo, Boykin Nearing, MD;  Location: [facility] ENDOSCOPY;  Service: Gastroenterology;  Laterality: N/A;   ESOPHAGOGASTRODUODENOSCOPY N/A 02/02/2021   Procedure:  ESOPHAGOGASTRODUODENOSCOPY (EGD);  Surgeon: Toledo, Boykin Nearing, MD;  Location: [facility] ENDOSCOPY;  Service: Gastroenterology;  Laterality: N/A;  PM arrival   ESOPHAGOGASTRODUODENOSCOPY (EGD) WITH PROPOFOL N/A 09/24/2015   Procedure: ESOPHAGOGASTRODUODENOSCOPY (EGD) WITH PROPOFOL;  Surgeon: Christena Deem, MD;  Location: [facility]COPY;  Service: Endoscopy;  Laterality: N/A;   NECK SURGERY     from front   tension free vaginal tape     TONSILLECTOMY     Patient Active Problem List   Diagnosis Date Noted   Rectocele 08/17/2016   Endocervical polyp 08/17/2016   History of TIA (transient ischemic attack) 06/28/2015   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) 06/28/2015   Shortness of breath on exertion 06/28/2015   Chest pain 06/17/2015   Acquired hypothyroidism 12/24/2014   Essential (primary) hypertension 10/12/2014   Urinary frequency 09/05/2014   Absolute anemia 08/27/2014   BP (high blood pressure) 08/27/2014   Hyperlipidemia 08/10/2014   GERD (gastroesophageal reflux disease) 08/10/2014   Fibrocystic breast 08/10/2014   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   Vaginal atrophy 08/10/2014   Prolapse of female pelvic organs 08/10/2014   OAB (overactive bladder) 08/10/2014   Urinary tract infection    Mitral valve prolapse 12/26/2012  Systolic dysfunction 12/26/2012   Chronic combined systolic and diastolic heart failure (HCC) 12/26/2012   Supraventricular tachycardia (HCC) 10/07/2012    ONSET DATE: R26.89 (ICD-10-CM) - Imbalance   REFERRING DIAG: R26.89 (ICD-10-CM) - Imbalance   THERAPY DIAG:  Unsteadiness on feet  Dizziness and giddiness  Rationale for Evaluation and Treatment: Rehabilitation  SUBJECTIVE:                                                                                                                                                                                             SUBJECTIVE STATEMENT: Pt returns to PT  reporting improvement in her dizziness symptoms over past 2-3 weeks. She describes dizziness as an off-balance feeling. She reports nothing brings on her symptoms or improves them, they com and go. She reports no spinning sensation. She can feel randomly lightheaded at times. She reports hx of a-fib diagnosis, but this was 25 years ago. She says only new issue is diverticulitis. She has a cardiology appt on the 18th and 30th.  Pt accompanied by: self  PERTINENT HISTORY: For the past 4 months pt has not been well. Pt has diverticulitis and IBS. Pt has been to many MDs and the the neurologist referred her here for her balance. The past few weeks she has been dizzy a lot. She reports her dizziness feels unsteady and feels she could pass out at times. Pt sees no correlation with positional changes and her dizziness. Pt reports in the past 4 months it has progressively gotten worse. No changes in medication as of recently, added an antibiotic for UTI about 4 weeks ago but did not notice any changes in balance with this.  Patient has no falls or loss of balance as related to her reported dizziness just periods of unsteadiness where she feels like she could fall.  Patient has history of arthritis, cardiomyopathy, congestive heart failure, chronic fatigue syndrome, COPD, hyperlipidemia, hypertension, osteoporosis, TIA/mini strokes, and narcolepsy.  PAIN:  Are you having pain? No  PRECAUTIONS: None  RED FLAGS: None   WEIGHT BEARING RESTRICTIONS: No  FALLS: Has patient fallen in last 6 months? Yes. Number of falls 1 when she tripped over something. No falls related to the dizziness.   LIVING ENVIRONMENT: Lives with: lives alone Lives in: House/apartment Stairs: Yes: Internal: 13 steps; but doesn't use them regularly Has following equipment at home: None  PLOF: Independent  PATIENT GOALS: Improve balance and be able to walk better.   OBJECTIVE:   DIAGNOSTIC FINDINGS: No evidence of acute  intracranial abnormality or reversible cause of memory loss.  COGNITION: Overall cognitive status: Within functional limits for tasks  assessed   SENSATION: Not tested     POSTURE: No Significant postural limitations  LOWER EXTREMITY MMT:     MMT  Right Eval Left Eval  Hip flexion 4 4  Hip extension    Hip abduction 4 4  Hip adduction 5 5  Hip internal rotation    Hip external rotation    Knee flexion 4+ 4+  Knee extension 4+ 4+  Ankle dorsiflexion 5 5  Ankle plantarflexion 5 5  Ankle inversion    Ankle eversion     (Blank rows = not tested)  BED MOBILITY:  No problems   TRANSFERS:no problem with STS transfers, difficulty getting up form a chair in the past few weeks.   Floor:  test at future date   RAMP:  Level of Assistance: Complete Independence Assistive device utilized: None Ramp Comments: pt reports no issues with ramps   CURB:  Level of Assistance:  pt has near LOB when descending steps for curb simulation without UE assist.  Assistive device utilized: None Curb Comments: min A to prevent LOB  STAIRS: Level of Assistance: Min A Stair Negotiation Technique: Alternating Pattern  with No Rails Number of Stairs: 4  Height of Stairs: 6 in  Comments: ascending no LOB, descending required min A and UE assist to prevent LOB, did not clear step at bottom properly.    FUNCTIONAL TESTS:  Five times Sit to Stand Test (FTSS)  TIME: 8 sec  Cut off scores indicative of increased fall risk: >12 sec CVA, >16 sec PD, >13 sec vestibular (ANPTA Core Set of Outcome Measures for Adults with Neurologic Conditions, 2018)  Pt scores 22 / 28 on mini BEST balance test. Scores < 16 indicate increased risk for falls Para March, [place], & [place]) and MCID is 4 (Godi,et al, 2013)   8.3 sec 10 MWT  PATIENT SURVEYS:  FOTO 57  TODAY'S TREATMENT: 03/05/23   NRM: vestibular assessment Cervical AROM screen- observed WNL for rotation, flex/ext and movement are pain-free and  symptom-free  Ocular ROM: WNL Smooth pursuits: WNL (mildly saccadic, likely normal for age) End-gaze nystagmus: absent Gaze-holding nystagmus: absent Slow VOR: WNL Convergence: WNL Saccades: catch-up saccade R gaze, possibly with L as well VOR cancellation: WNL  Head impulse test - negative bilaterally   Positional testing: Dix-Hallpike and Roll test both negative bilaterally  Orthostatics- Supine- 129/76 mmHg (91) HR 70 bpm Seated - 134/75  mmHg (93) HR 72 BPM Standing - 128/101 mmHg (MAP 111)  Retest of Standing BP after another minute - 135/88 mmHg  Comments: instructed pt to take BP at home when feeling dizzy, however, pt not symptomatic with testing today  MCTSIB: Condition 1: completes Condition 2: completes but with difficulty Condition 3: completes but reports more difficult than condition 2 Condition 4: unable to complete, LOB after 1-3 sec, requires min a from PT to correct         PATIENT EDUCATION: Education details: vestibular assessment findings Person educated: Patient Education method: Explanation Education comprehension: verbalized understanding  HOME EXERCISE PROGRAM:  Access Code: [email or web address] URL: [email or web address] Date: 12/06/2022 Prepared by: Maureen Ralphs  Exercises - Seated Gaze Stabilization with Head Rotation  - 3 x weekly - 3 sets - 10 reps - Seated Horizontal Smooth Pursuit  - 3 x weekly - 3 sets - 10 reps - Tandem Stance  - 3 x weekly - 3 sets - 10 reps - Single Leg Stance  - 3 x weekly - 3-4 sets - 10-20 hold   GOALS:  Goals reviewed with patient? Yes  SHORT TERM GOALS: Target date: 01/01/2023   Patient will be independent in home exercise program to improve strength/mobility for better functional independence with ADLs. Baseline: No HEP currently 02/07/23: Was doing in October but not much since due to dizziness  Goal status: INITIAL   LONG TERM GOALS: Target date: 01/29/2023   1.  Patient will  increase FOTO score to equal to or greater than  64   to demonstrate statistically significant improvement in mobility and quality of life.  Baseline: 57 11/13: 56 Goal status: ONGOING   2.  Patient will increase Mini BEST Balance score by > 4 points to demonstrate decreased fall risk during functional activities. Baseline: 22 Goal status: MET   3.  Patient will reduce timed up and go with dual task to <11 seconds to reduce fall risk and demonstrate improved transfer/gait ability. Baseline: 13 sec 11/13: 13 sec (same as dual task) Goal status: ONGOING  4.  Patient will improve bilateral lower extremity single-leg stance to 20 seconds or greater in order to demonstrate improvement in balance and stability. Baseline: 3 seconds on right lower extremity 15 seconds on left lower extremity 11/13: 20 sec on ea LE on second attempt  Goal status: MET  5.  Patient will ascend and descend 4 stairs without upper extremity assist in order to improve her safety with stair navigation within the community.  Baseline: Patient has loss of balance when descending stairs without upper extremity assist initial eval requiring min assist from therapist to prevent fall 11/13: Able to complete without LOB  Goal status: MET     ASSESSMENT:  CLINICAL IMPRESSION: Vestibular assessment performed today. Majority of findings were WNL. Pt only with abnormal saccades, but no dizziness with testing, all positional testing negative, indicating no clear vestibular contribution for current impairments. Pt not symptomatic with orthostatics, no significant BP drop observed. Diastolic briefly high with standing, but improved with second reading. PT instructed pt to take BP at home when feeling dizzy. Pt agreeable to this plan. Pt with impaired balance findings, unable to complete condition 4 on MCTSIB. She would benefit from working on [facility] balance activities. The pt will benefit from further skilled PT to address balance and  decrease fall risk.   OBJECTIVE IMPAIRMENTS: decreased activity tolerance, decreased balance, decreased mobility, and decreased strength.   ACTIVITY LIMITATIONS: standing, squatting, and stairs  PARTICIPATION LIMITATIONS: laundry, community activity, and yard work  PERSONAL FACTORS: Age and 3+ comorbidities: arthritis, cardiomyopathy, congestive heart failure, chronic fatigue syndrome, COPD, hyperlipidemia, hypertension, osteoporosis, TIA/mini strokes, and narcolepsy.  are also affecting patient's functional outcome.   REHAB POTENTIAL: Good  CLINICAL DECISION MAKING: Evolving/moderate complexity  EVALUATION COMPLEXITY: Moderate  PLAN:  PT FREQUENCY: 1-2x/week  PT DURATION: 8 weeks  PLANNED INTERVENTIONS: Therapeutic exercises, Therapeutic activity, Neuromuscular re-education, Balance training, Gait training, Patient/Family education, Self Care, Joint mobilization, and Stair training  PLAN FOR NEXT SESSION: balance Baird Kay PT ,DPT Physical Therapist- [place]  [facility]   03/05/23, 5:17 PM

## 2023-03-07 ENCOUNTER — Ambulatory Visit: Payer: Medicare PPO | Admitting: Physical Therapy

## 2023-03-08 ENCOUNTER — Encounter: Payer: Medicare PPO | Admitting: Occupational Therapy

## 2023-03-12 ENCOUNTER — Ambulatory Visit: Payer: Medicare PPO | Admitting: Physical Therapy

## 2023-03-14 ENCOUNTER — Ambulatory Visit: Payer: Medicare PPO | Admitting: Physical Therapy

## 2023-03-16 ENCOUNTER — Ambulatory Visit: Payer: Medicare PPO | Admitting: Occupational Therapy

## 2023-03-16 ENCOUNTER — Other Ambulatory Visit: Payer: Self-pay | Admitting: Physician Assistant

## 2023-03-16 DIAGNOSIS — R42 Dizziness and giddiness: Secondary | ICD-10-CM

## 2023-03-16 DIAGNOSIS — R251 Tremor, unspecified: Secondary | ICD-10-CM

## 2023-03-16 DIAGNOSIS — R4189 Other symptoms and signs involving cognitive functions and awareness: Secondary | ICD-10-CM

## 2023-03-16 DIAGNOSIS — R2689 Other abnormalities of gait and mobility: Secondary | ICD-10-CM

## 2023-03-16 DIAGNOSIS — R519 Headache, unspecified: Secondary | ICD-10-CM

## 2023-03-19 ENCOUNTER — Ambulatory Visit: Payer: Medicare PPO

## 2023-03-26 ENCOUNTER — Ambulatory Visit: Payer: Medicare PPO | Admitting: Physical Therapy

## 2023-04-02 ENCOUNTER — Encounter: Payer: Self-pay | Admitting: Physician Assistant

## 2023-04-02 ENCOUNTER — Ambulatory Visit: Payer: Medicare PPO

## 2023-04-04 ENCOUNTER — Ambulatory Visit: Payer: Medicare PPO

## 2023-04-05 ENCOUNTER — Other Ambulatory Visit: Payer: Medicare PPO

## 2023-04-09 ENCOUNTER — Ambulatory Visit: Payer: Medicare PPO

## 2023-04-11 ENCOUNTER — Ambulatory Visit: Payer: Medicare PPO

## 2023-04-14 ENCOUNTER — Ambulatory Visit
Admission: RE | Admit: 2023-04-14 | Discharge: 2023-04-14 | Disposition: A | Discharge status: Home / Self Care | Payer: Medicare PPO | Source: Ambulatory Visit | Attending: Physician Assistant | Admitting: Physician Assistant

## 2023-04-14 DIAGNOSIS — R251 Tremor, unspecified: Secondary | ICD-10-CM

## 2023-04-14 DIAGNOSIS — R42 Dizziness and giddiness: Secondary | ICD-10-CM

## 2023-04-14 DIAGNOSIS — R4189 Other symptoms and signs involving cognitive functions and awareness: Secondary | ICD-10-CM

## 2023-04-14 DIAGNOSIS — R519 Headache, unspecified: Secondary | ICD-10-CM

## 2023-04-14 DIAGNOSIS — R2689 Other abnormalities of gait and mobility: Secondary | ICD-10-CM

## 2023-04-16 ENCOUNTER — Ambulatory Visit: Payer: Medicare PPO

## 2023-04-18 ENCOUNTER — Ambulatory Visit: Payer: Medicare PPO

## 2023-04-23 ENCOUNTER — Ambulatory Visit: Payer: Medicare PPO

## 2023-04-25 ENCOUNTER — Ambulatory Visit: Payer: Medicare PPO

## 2023-04-30 ENCOUNTER — Ambulatory Visit: Payer: Medicare PPO

## 2023-05-02 ENCOUNTER — Ambulatory Visit: Payer: Medicare PPO

## 2023-05-07 ENCOUNTER — Ambulatory Visit: Payer: Medicare PPO

## 2023-05-09 ENCOUNTER — Ambulatory Visit: Payer: Medicare PPO

## 2023-05-14 ENCOUNTER — Ambulatory Visit: Payer: Medicare PPO

## 2023-05-16 ENCOUNTER — Ambulatory Visit: Payer: Medicare PPO

## 2023-05-21 ENCOUNTER — Ambulatory Visit: Payer: Medicare PPO

## 2023-05-22 ENCOUNTER — Ambulatory Visit
Admission: RE | Admit: 2023-05-22 | Discharge: 2023-05-22 | Disposition: A | Discharge status: Home / Self Care | Payer: Medicare PPO | Source: Ambulatory Visit | Attending: Internal Medicine | Admitting: Internal Medicine

## 2023-05-22 DIAGNOSIS — Z1231 Encounter for screening mammogram for malignant neoplasm of breast: Secondary | ICD-10-CM | POA: Insufficient documentation

## 2023-05-23 ENCOUNTER — Ambulatory Visit: Payer: Medicare PPO

## 2023-05-28 ENCOUNTER — Ambulatory Visit: Payer: Medicare PPO

## 2023-05-30 ENCOUNTER — Ambulatory Visit: Payer: Medicare PPO

## 2023-06-04 ENCOUNTER — Ambulatory Visit: Payer: Medicare PPO

## 2023-06-06 ENCOUNTER — Ambulatory Visit: Payer: Medicare PPO

## 2023-06-11 ENCOUNTER — Ambulatory Visit: Payer: Medicare PPO

## 2023-06-13 ENCOUNTER — Ambulatory Visit: Payer: Medicare PPO

## 2023-06-20 ENCOUNTER — Ambulatory Visit: Payer: Medicare PPO

## 2023-06-25 ENCOUNTER — Ambulatory Visit: Payer: Medicare PPO

## 2023-06-27 ENCOUNTER — Ambulatory Visit: Payer: Medicare PPO

## 2023-07-02 ENCOUNTER — Ambulatory Visit: Payer: Medicare PPO

## 2023-07-04 ENCOUNTER — Ambulatory Visit: Payer: Medicare PPO

## 2023-07-09 ENCOUNTER — Ambulatory Visit: Payer: Medicare PPO

## 2023-07-11 ENCOUNTER — Ambulatory Visit: Payer: Medicare PPO

## 2023-07-16 ENCOUNTER — Ambulatory Visit: Payer: Medicare PPO

## 2023-07-18 ENCOUNTER — Ambulatory Visit: Payer: Medicare PPO

## 2023-07-23 ENCOUNTER — Ambulatory Visit: Payer: Medicare PPO

## 2023-07-25 ENCOUNTER — Ambulatory Visit: Payer: Medicare PPO

## 2023-07-30 ENCOUNTER — Ambulatory Visit: Payer: Medicare PPO

## 2023-08-01 ENCOUNTER — Ambulatory Visit: Payer: Medicare PPO

## 2023-08-06 ENCOUNTER — Ambulatory Visit: Payer: Medicare PPO

## 2023-08-08 ENCOUNTER — Ambulatory Visit: Payer: Medicare PPO

## 2023-08-13 ENCOUNTER — Ambulatory Visit: Payer: Medicare PPO

## 2023-08-15 ENCOUNTER — Ambulatory Visit: Payer: Medicare PPO

## 2023-08-16 ENCOUNTER — Other Ambulatory Visit: Payer: Self-pay | Admitting: Internal Medicine

## 2023-08-16 DIAGNOSIS — R634 Abnormal weight loss: Secondary | ICD-10-CM

## 2023-08-16 DIAGNOSIS — I10 Essential (primary) hypertension: Secondary | ICD-10-CM

## 2023-08-16 DIAGNOSIS — R0789 Other chest pain: Secondary | ICD-10-CM

## 2023-08-22 ENCOUNTER — Ambulatory Visit: Payer: Medicare PPO

## 2023-08-27 ENCOUNTER — Ambulatory Visit: Payer: Medicare PPO

## 2023-08-29 ENCOUNTER — Ambulatory Visit
Admission: RE | Admit: 2023-08-29 | Discharge: 2023-08-29 | Disposition: A | Discharge status: Home / Self Care | Source: Ambulatory Visit | Attending: Internal Medicine | Admitting: Internal Medicine

## 2023-08-29 ENCOUNTER — Other Ambulatory Visit

## 2023-08-29 ENCOUNTER — Ambulatory Visit: Payer: Medicare PPO

## 2023-08-29 DIAGNOSIS — R634 Abnormal weight loss: Secondary | ICD-10-CM | POA: Insufficient documentation

## 2023-08-29 DIAGNOSIS — R0789 Other chest pain: Secondary | ICD-10-CM | POA: Insufficient documentation

## 2023-08-29 DIAGNOSIS — I10 Essential (primary) hypertension: Secondary | ICD-10-CM | POA: Insufficient documentation

## 2023-08-29 LAB — POCT I-STAT CREATININE: Creatinine, Ser: 1.1 mg/dL — ABNORMAL HIGH (ref 0.44–1.00)

## 2023-08-29 MED ORDER — IOHEXOL 300 MG/ML  SOLN
100.0000 mL | Freq: Once | INTRAMUSCULAR | Status: AC | PRN
  Administered 2023-08-29: 100 mL via INTRAVENOUS

## 2023-09-03 ENCOUNTER — Ambulatory Visit: Payer: Medicare PPO

## 2023-09-05 ENCOUNTER — Ambulatory Visit: Payer: Medicare PPO

## 2023-09-10 ENCOUNTER — Ambulatory Visit: Payer: Medicare PPO

## 2023-09-12 ENCOUNTER — Ambulatory Visit: Payer: Medicare PPO

## 2023-09-17 ENCOUNTER — Ambulatory Visit: Payer: Medicare PPO

## 2023-09-19 ENCOUNTER — Ambulatory Visit: Payer: Medicare PPO

## 2023-09-24 ENCOUNTER — Ambulatory Visit: Payer: Medicare PPO

## 2023-09-26 ENCOUNTER — Ambulatory Visit: Payer: Medicare PPO

## 2023-10-01 ENCOUNTER — Ambulatory Visit: Payer: Medicare PPO

## 2023-10-03 ENCOUNTER — Ambulatory Visit: Payer: Medicare PPO

## 2023-10-03 NOTE — Progress Notes (Signed)
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 07/23/2023 4.8  3.5 - 4.8 g/dL Final  . Bilirubin, Total 07/23/2023 0.8  0.3 - 1.2 mg/dL Final  . Protein, Total 07/23/2023 6.6  6.1 - 7.9 g/dL Final  . A/G Ratio 95/71/7974 2.7  1.0 - 5.0 gm/dL Final  . Color 95/71/7974 Yellow  Colorless, Straw, Light Yellow, Yellow, Dark Yellow Final  . Clarity 07/23/2023 Clear  Clear Final  . Specific Gravity 07/23/2023 1.025  1.005 - 1.030 Final  . pH, Urine 07/23/2023 6.5  5.0 - 8.0 Final  . Protein, Urinalysis 07/23/2023 1+ (!)  Negative mg/dL Final  . Glucose, Urinalysis 07/23/2023 Negative  Negative mg/dL Final  . Ketones, Urinalysis 07/23/2023 1+ (!)  Negative mg/dL Final  . Blood, Urinalysis 07/23/2023 Negative  Negative Final  . Nitrite, Urinalysis 07/23/2023 Negative  Negative Final  . Leukocyte Esterase, Urinalysis 07/23/2023 Negative  Negative Final  . Bilirubin, Urinalysis 07/23/2023 Negative  Negative Final  . Urobilinogen, Urinalysis 07/23/2023 0.2  0.2 - 1.0 mg/dL Final  . WBC, UA  95/71/7974 1  <=5 /hpf Final  . Red Blood Cells, Urinalysis 07/23/2023 0  <=3 /hpf Final  . Bacteria, Urinalysis 07/23/2023 0-5  0 - 5 /hpf Final  . Squamous Epithelial Cells, Urinaly* 07/23/2023 0  /hpf Final  Office Visit on 07/23/2023  Component Date Value Ref Range Status  . Vitamin B12 07/23/2023 961  >300 pg/mL Final  . Folate (Folic Acid) 07/23/2023 >22.3  >4.0 ng/mL Final  . Vitamin B6 - [facility] 07/23/2023 115.6 (H)  3.4 - 65.2 ug/L Final  . Vitamin D, 25-Hydroxy - [facility] 07/23/2023 121.0 (H)  30.0 - 100.0 ng/mL Final  .  RPR - [facility] 07/23/2023 Non Reactive  Non Reactive Final  . AChR Binding Abs, Serum - [facility] 07/23/2023 <0.07  0.00 - 0.24 nmol/L Final  . AChR Blocking Abs, Serum - [facility] 07/23/2023 13  0 - 25 % Final  . AChR Modulating Ab - [facility] 07/23/2023 3  0 - 45 % Final  . Anti-striation Abs - [facility] 07/23/2023 Negative  Neg:<1:100 Final  . Reflex Information - [facility] 07/23/2023 Comment   Final  . MuSK Antibody - [facility] 07/23/2023 <1.0  U/mL Final  Appointment on 05/23/2023  Component Date Value Ref Range Status  . Color 05/23/2023 Yellow  Colorless, Straw, Light Yellow, Yellow, Dark Yellow Final  . Clarity 05/23/2023 Clear  Clear Final  . Specific Gravity 05/23/2023 1.017  1.005 - 1.030 Final  . pH, Urine 05/23/2023 6.0  5.0 - 8.0 Final  . Protein, Urinalysis 05/23/2023 Trace (!)  Negative mg/dL Final  . Glucose, Urinalysis 05/23/2023 Negative  Negative mg/dL Final  . Ketones, Urinalysis 05/23/2023 2+ (!)  Negative mg/dL Final  . Blood, Urinalysis 05/23/2023 Negative  Negative Final  . Nitrite, Urinalysis 05/23/2023 Negative  Negative Final  . Leukocyte Esterase, Urinalysis 05/23/2023 Negative  Negative Final  . Bilirubin, Urinalysis 05/23/2023 Negative  Negative Final  . Urobilinogen, Urinalysis 05/23/2023 0.2  0.2 - 1.0 mg/dL Final  . WBC, UA 97/73/7974 <1  <=5 /hpf Final  . Red Blood Cells, Urinalysis 05/23/2023 7 (H)  <=3 /hpf Final  . Bacteria,  Urinalysis 05/23/2023 0-5  0 - 5 /hpf Final  . Squamous Epithelial Cells, Urinaly* 05/23/2023 0  /hpf Final  . Urine Culture, Routine - [facility] 05/23/2023 Final report   Final  . Result 1 - [facility] 05/23/2023 Comment   Final  Office Visit on 05/18/2023  Component Date Value Ref Range Status  . Cholesterol, Total 05/18/2023 162  100 - 200 mg/dL Final  . Triglyceride 97/78/7974 73  35 - 199 mg/dL Final  . HDL (High Density Lipoprotein) Cho* 05/18/2023 67.3  35.0 - 85.0 mg/dL Final  . LDL Calculated 05/18/2023 80  0 - 130 mg/dL Final  . VLDL Cholesterol 05/18/2023 15  mg/dL Final  . Cholesterol/HDL Ratio 05/18/2023 2.4   Final  . WBC (White Blood Cell Count) 05/18/2023 4.7  4.1 - 10.2 10^3/uL Final  . RBC (Red Blood Cell Count) 05/18/2023 3.93 (L)  4.04 - 5.48 10^6/uL Final  . Hemoglobin 05/18/2023 12.4  12.0 - 15.0 gm/dL Final  . Hematocrit 97/78/7974 36.8  35.0 - 47.0 % Final  . MCV (Mean Corpuscular Volume) 05/18/2023 93.6  80.0 - 100.0 fl Final  . MCH (Mean Corpuscular Hemoglobin) 05/18/2023 31.6 (H)  27.0 - 31.2 pg Final  . MCHC (Mean Corpuscular Hemoglobin * 05/18/2023 33.7  32.0 - 36.0 gm/dL Final  . Platelet Count 05/18/2023 231  150 - 450 10^3/uL Final  . RDW-CV (Red Cell Distribution Widt* 05/18/2023 12.3  11.6 - 14.8 % Final  . MPV (Mean Platelet Volume) 05/18/2023 10.5  9.4 - 12.4 fl Final  . Neutrophils 05/18/2023 3.11  1.50 - 7.80 10^3/uL Final  . Lymphocytes 05/18/2023 1.13  1.00 - 3.60 10^3/uL Final  . Monocytes 05/18/2023 0.35  0.00 - 1.50 10^3/uL Final  . Eosinophils 05/18/2023 0.07  0.00 - 0.55 10^3/uL Final  . Basophils 05/18/2023 0.02  0.00 - 0.09 10^3/uL Final  . Neutrophil % 05/18/2023 66.3  32.0 - 70.0 % Final  . Lymphocyte % 05/18/2023 24.1  10.0 - 50.0 % Final  . Monocyte % 05/18/2023 7.5  4.0 - 13.0 % Final  . Eosinophil % 05/18/2023 1.5  1.0 - 5.0 % Final  . Basophil% 05/18/2023 0.4  0.0 - 2.0 % Final  . Immature Granulocyte % 05/18/2023 0.2  <=0.7 % Final  .  Immature Granulocyte Count 05/18/2023 0.01  <=0.06 10^3/L Final  . Glucose 05/18/2023 116 (H)  70 - 110 mg/dL Final  . Sodium 97/78/7974 140  136 - 145 mmol/L Final  . Potassium 05/18/2023 3.7  3.6 - 5.1 mmol/L Final  . Chloride 05/18/2023 109  97 - 109 mmol/L Final  . Carbon Dioxide (CO2) 05/18/2023 24.8  22.0 - 32.0 mmol/L Final  . Urea Nitrogen (BUN) 05/18/2023 23  7 - 25 mg/dL Final  . Creatinine 97/78/7974 0.9  0.6 - 1.1 mg/dL Final  . Glomerular Filtration Rate (eGFR) 05/18/2023 66  >60 mL/min/1.73sq m Final  . Calcium 05/18/2023 9.3  8.7 - 10.3 mg/dL Final  . AST  97/78/7974 20  8 - 39 U/L Final  . ALT  05/18/2023 17  5 - 38 U/L Final  . Alk Phos (alkaline Phosphatase) 05/18/2023 49  34 - 104 U/L Final  . Albumin 05/18/2023 4.4  3.5 - 4.8 g/dL Final  . Bilirubin, Total 05/18/2023 0.4  0.3 - 1.2 mg/dL Final  . Protein, Total 05/18/2023 6.7  6.1 - 7.9 g/dL Final  . A/G Ratio 97/78/7974 1.9  1.0 - 5.0 gm/dL Final  . Thyroid Stimulating Hormone (TSH) 05/18/2023 2.015  0.450-5.330 uIU/ml uIU/mL Final  . Thyroxine, Free (FT4) 05/18/2023 0.85  0.66 - 1.14 ng/dL Final  . Triiodothyronine (T3), Total 05/18/2023 136  87 - 178 ng/dL Final  . Color 97/78/7974 Yellow  Colorless, Straw, Light Yellow, Yellow, Dark Yellow Final  . Clarity 05/18/2023 Clear  Clear Final  . Specific Gravity 05/18/2023 1.028  1.005 - 1.030 Final  . pH, Urine 05/18/2023 6.0  5.0 - 8.0 Final  . Protein, Urinalysis 05/18/2023 Trace (!)  Negative mg/dL Final  . Glucose, Urinalysis 05/18/2023 Negative  Negative mg/dL Final  . Ketones, Urinalysis 05/18/2023 Negative  Negative mg/dL Final  . Blood, Urinalysis 05/18/2023 Negative  Negative Final  . Nitrite, Urinalysis 05/18/2023 Negative  Negative Final  . Leukocyte Esterase, Urinalysis 05/18/2023 Negative  Negative Final  . Bilirubin, Urinalysis 05/18/2023 Negative  Negative Final  . Urobilinogen, Urinalysis 05/18/2023 0.2  0.2 - 1.0 mg/dL Final  . WBC, UA 97/78/7974 1   <=5 /hpf Final  . Red Blood Cells, Urinalysis 05/18/2023 1  <=3 /hpf Final  . Bacteria, Urinalysis 05/18/2023 0-5  0 - 5 /hpf Final  . Squamous Epithelial Cells, Urinaly* 05/18/2023 0  /hpf Final  . Influenza A PCR 05/18/2023 Negative  Negative Final  . Influenza B PCR 05/18/2023 Negative  Negative Final  . RSV PCR 05/18/2023 Negative  Negative Final  . SARS-CoV2 PCR 05/18/2023 Negative  Negative Final    No follow-ups on file.  Payor: [employer] PLANS / Plan: [employer] PPO CHOICE / Product Type: PPO /   This note is partially written by Sarah Almashhadani, in the presence of and acting as the scribe of Lauraine Rocks, PA-C.     I agree that the scribe documentation is complete and accurate.  This note was generated in part with voice recognition software and I apologize for any typographical errors that were not detected and corrected.     Attestation Statement:   I personally performed the service, non-incident to. (WP)   SARAH ALMARIE ROCKS, PA  This note has been created using automated tools and reviewed for accuracy by [facility].

## 2023-10-08 ENCOUNTER — Ambulatory Visit: Payer: Medicare PPO

## 2023-10-10 ENCOUNTER — Ambulatory Visit: Payer: Medicare PPO

## 2023-10-15 ENCOUNTER — Ambulatory Visit: Payer: Medicare PPO

## 2023-10-17 ENCOUNTER — Ambulatory Visit: Payer: Medicare PPO

## 2023-10-27 IMAGING — MG MM DIGITAL SCREENING BILAT W/ TOMO AND CAD
8 series · 9 of 24 positions shown · non-contrast
Comparison: Previous exam(s).

CLINICAL DATA: Screening.

EXAM:
DIGITAL SCREENING BILATERAL MAMMOGRAM WITH TOMOSYNTHESIS AND CAD
TECHNIQUE: Bilateral screening digital craniocaudal and mediolateral oblique
mammograms were obtained. Bilateral screening digital breast
tomosynthesis was performed. The images were evaluated with
computer-aided detection.

[L CC synth-2D]
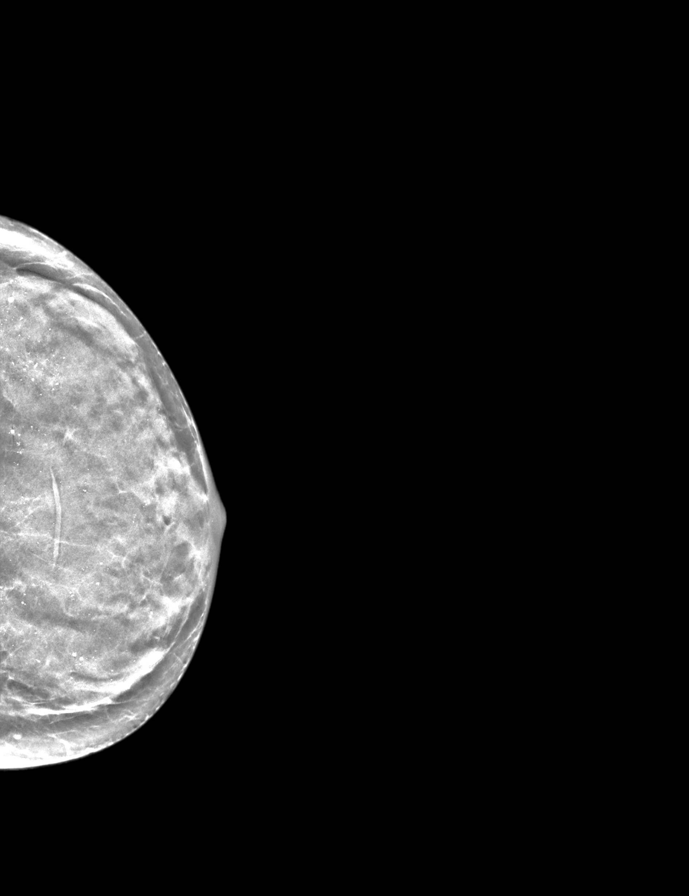

[R CC synth-2D]
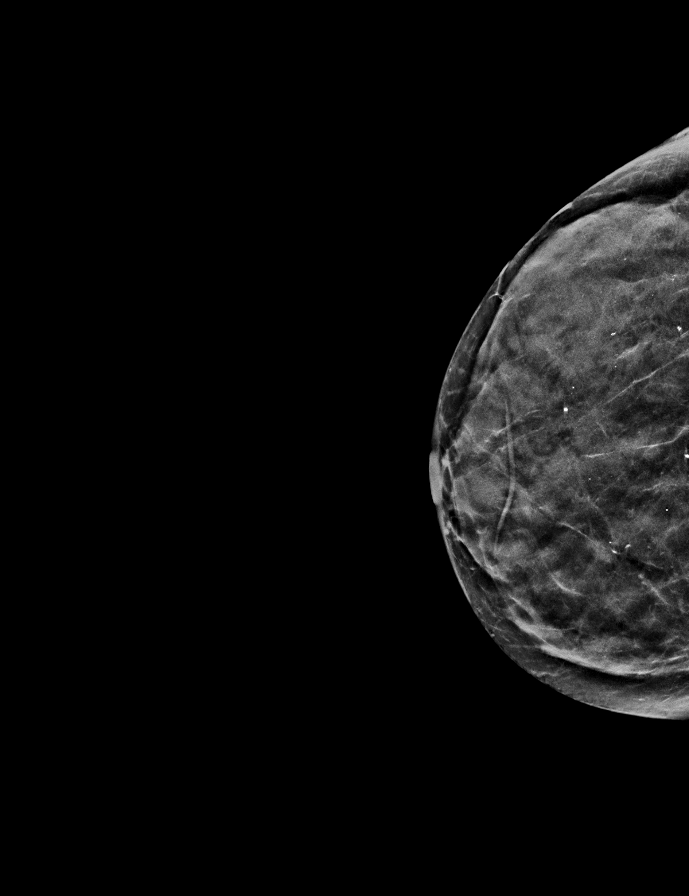

[R MLO synth-2D]
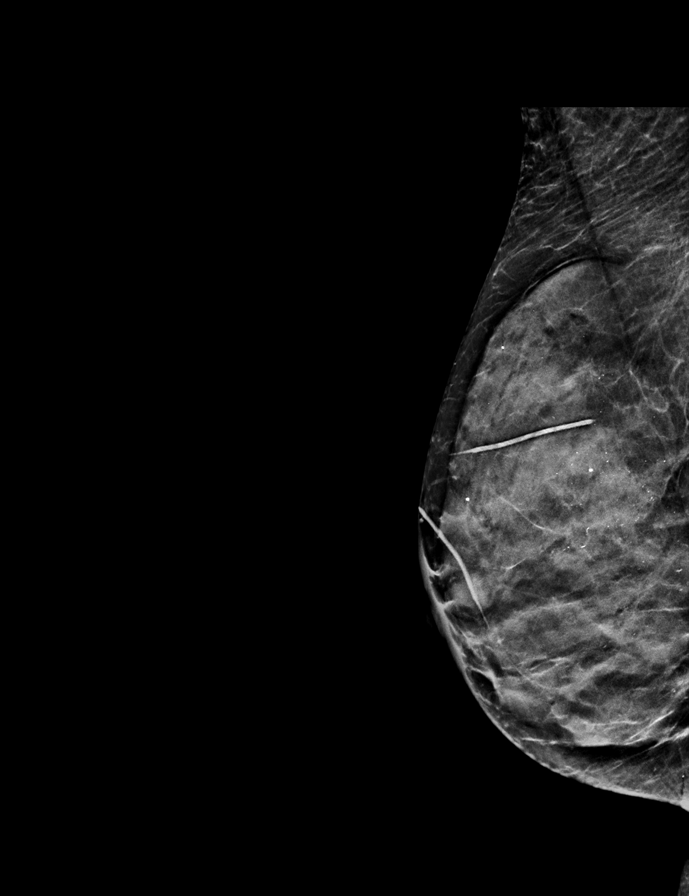

[L MLO synth-2D]
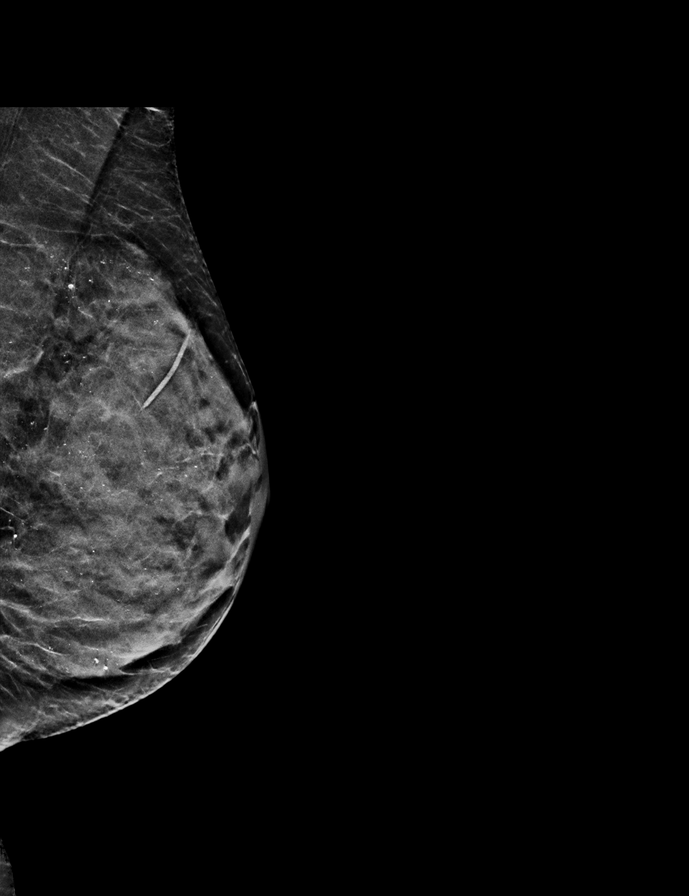

[R MLO tomo · 2 of 44 frames shown]
[frame 15/44]
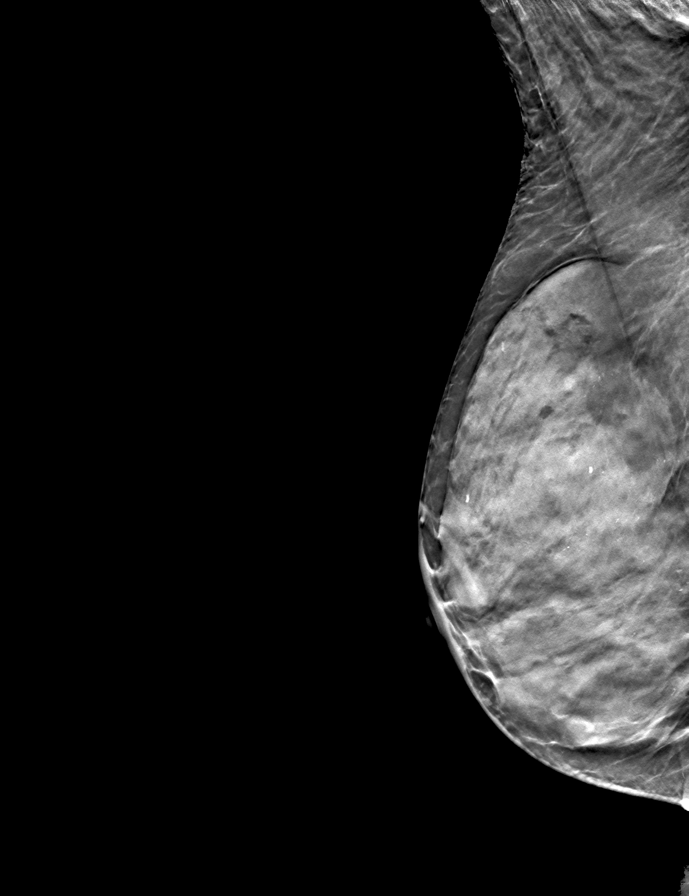
[frame 23/44]
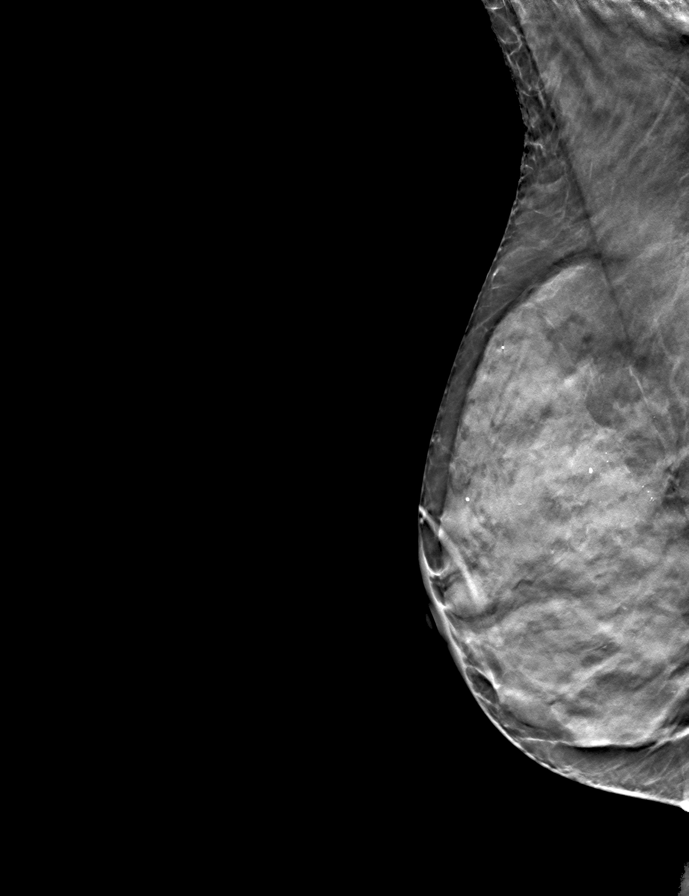

[L CC tomo · tomo slice 22/43.0]
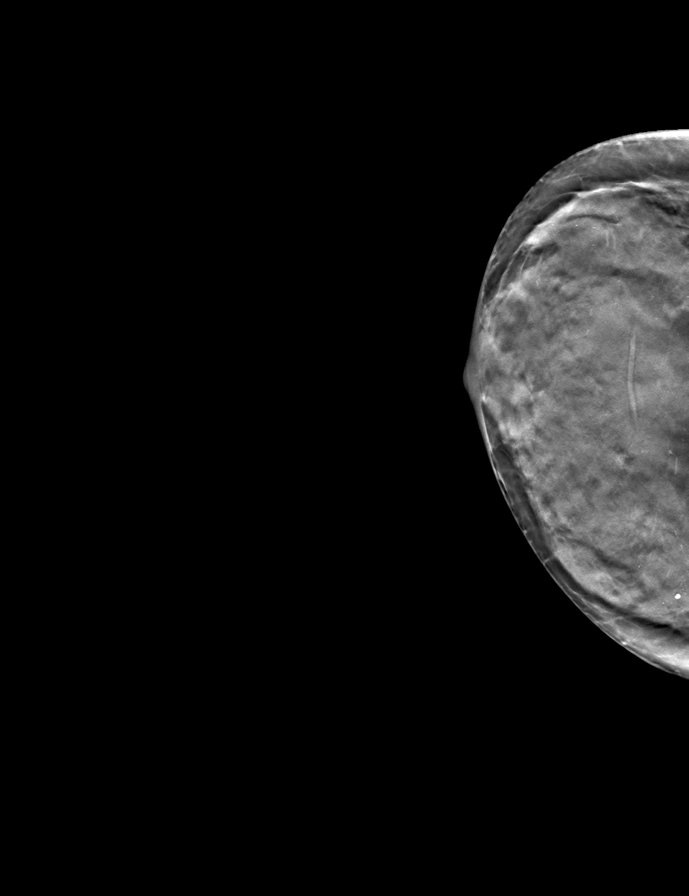

[L MLO tomo · tomo slice 21/42.0]
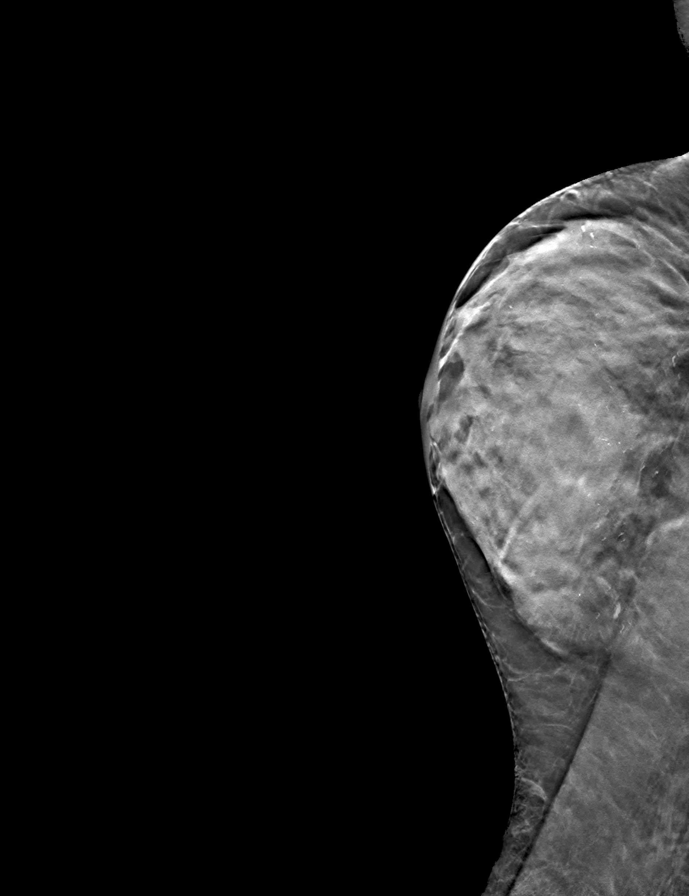

[R CC tomo · tomo slice 23/44.0]
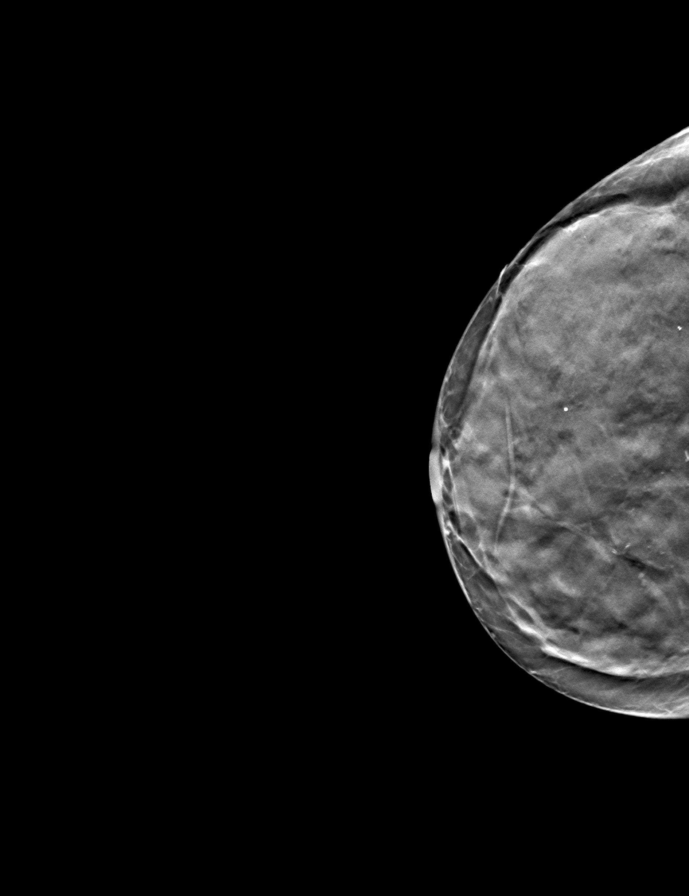

[9 of 24 positions shown; findings below may reference images not displayed]

ACR Breast Density Category d: The breast tissue is extremely dense,
which lowers the sensitivity of mammography
FINDINGS: There are no findings suspicious for malignancy.
IMPRESSION: No mammographic evidence of malignancy. A result letter of this
screening mammogram will be mailed directly to the patient.

RECOMMENDATION:
Screening mammogram in one year. (Code:[id number])

BI-RADS CATEGORY  1: Negative.

## 2023-11-06 ENCOUNTER — Other Ambulatory Visit: Payer: Self-pay

## 2023-11-06 ENCOUNTER — Emergency Department

## 2023-11-06 ENCOUNTER — Encounter: Payer: Self-pay | Admitting: Emergency Medicine

## 2023-11-06 ENCOUNTER — Emergency Department
Admission: EM | Admit: 2023-11-06 | Discharge: 2023-11-06 | Disposition: A | Discharge status: Home / Self Care | Attending: Emergency Medicine | Admitting: Emergency Medicine

## 2023-11-06 DIAGNOSIS — I110 Hypertensive heart disease with heart failure: Secondary | ICD-10-CM | POA: Insufficient documentation

## 2023-11-06 DIAGNOSIS — J449 Chronic obstructive pulmonary disease, unspecified: Secondary | ICD-10-CM | POA: Insufficient documentation

## 2023-11-06 DIAGNOSIS — R0602 Shortness of breath: Secondary | ICD-10-CM | POA: Diagnosis present

## 2023-11-06 DIAGNOSIS — I509 Heart failure, unspecified: Secondary | ICD-10-CM | POA: Diagnosis not present

## 2023-11-06 LAB — BASIC METABOLIC PANEL WITH GFR
Anion gap: 11 (ref 5–15)
BUN: 18 mg/dL (ref 8–23)
CO2: 23 mmol/L (ref 22–32)
Calcium: 8.9 mg/dL (ref 8.9–10.3)
Chloride: 105 mmol/L (ref 98–111)
Creatinine, Ser: 0.89 mg/dL (ref 0.44–1.00)
GFR, Estimated: >60 mL/min (ref >60)
Glucose, Bld: 112 mg/dL — ABNORMAL HIGH (ref 70–99)
Potassium: 4.3 mmol/L (ref 3.5–5.1)
Sodium: 139 mmol/L (ref 135–145)

## 2023-11-06 LAB — CBC
HCT: 35.8 % — ABNORMAL LOW (ref 36.0–46.0)
Hemoglobin: 11.8 g/dL — ABNORMAL LOW (ref 12.0–15.0)
MCH: 31.5 pg (ref 26.0–34.0)
MCHC: 33 g/dL (ref 30.0–36.0)
MCV: 95.5 fL (ref 80.0–100.0)
Platelets: 414 K/uL — ABNORMAL HIGH (ref 150–400)
RBC: 3.75 MIL/uL — ABNORMAL LOW (ref 3.87–5.11)
RDW: 13.1 % (ref 11.5–15.5)
WBC: 8.8 K/uL (ref 4.0–10.5)
nRBC: 0 % (ref 0.0–0.2)

## 2023-11-06 LAB — TROPONIN I (HIGH SENSITIVITY)
Troponin I (High Sensitivity): 20 ng/L — ABNORMAL HIGH (ref <18)
Troponin I (High Sensitivity): 19 ng/L — ABNORMAL HIGH (ref <18)

## 2023-11-06 MED ORDER — FUROSEMIDE 20 MG PO TABS: 20.0000 mg | ORAL_TABLET | Freq: Every day | ORAL | 2 refills | Status: AC

## 2023-11-06 MED ORDER — HYOSCYAMINE SULFATE 0.125 MG PO TABS: 0.1250 mg | ORAL_TABLET | Freq: Four times a day (QID) | ORAL | 2 refills | Status: DC | PRN

## 2023-11-06 MED ORDER — FUROSEMIDE 20 MG PO TABS: 20.0000 mg | ORAL_TABLET | Freq: Every day | ORAL | 2 refills | Status: DC

## 2023-11-06 MED ORDER — HYOSCYAMINE SULFATE 0.125 MG PO TABS: 0.1250 mg | ORAL_TABLET | Freq: Four times a day (QID) | ORAL | 2 refills | Status: AC | PRN

## 2023-11-06 NOTE — ED Notes (Signed)
 Pt reports increased SOB. Vitals obtained

## 2023-11-06 NOTE — Discharge Instructions (Addendum)
 Has been discussed please begin taking your furosemide  20 mg once daily each morning.  Please follow-up with your doctor to have labs rechecked in about 2 weeks.  Return to the emergency department for any worsening shortness of breath any chest pain or any other symptom personally concerning to yourself.

## 2023-11-06 NOTE — ED Triage Notes (Signed)
 Pt reports SHOB x 2 weeks and abdominal pain. PT reports she was sent for abnormal EKG from [facility]. Pt denies chest or back pain. Pt reports the [facility] is worse with exertion.

## 2023-11-06 NOTE — ED Notes (Signed)
 Hyoscyamine  0.125mg  SL

## 2023-11-06 NOTE — ED Provider Notes (Signed)
 [facility] Provider Note    Event Date/Time   First MD Initiated Contact with Patient 11/06/23 1709     (approximate)  History   Chief Complaint: Shortness of Breath  HPI  DERA VANAKEN is a 78 y.o. female with a past medical history of anemia, arthritis, CHF, COPD, hypertension, hyperlipidemia, CVA, presents to the emergency department for shortness of breath.  According to the patient with last 3 weeks she has had worsening shortness of breath.  Patient states she is having shortness of breath when she lies flat although states has been going on for quite some time.  Patient states occasional cough.  No chest pain.  No fever.  Physical Exam   Triage Vital Signs: ED Triage Vitals  Encounter Vitals Group     BP 11/06/23 1223 (!) 148/90     Girls Systolic BP Percentile --      Girls Diastolic BP Percentile --      Boys Systolic BP Percentile --      Boys Diastolic BP Percentile --      Pulse Rate 11/06/23 1223 82     Resp 11/06/23 1223 16     Temp 11/06/23 1223 97.9 F (36.6 C)     Temp Source 11/06/23 1223 Oral     SpO2 11/06/23 1223 98 %     Weight --      Height --      Head Circumference --      Peak Flow --      Pain Score 11/06/23 1219 5     Pain Loc --      Pain Education --      Exclude from Growth Chart --     Most recent vital signs: Vitals:   11/06/23 1431 11/06/23 1700  BP: (!) 139/103 (!) 141/99  Pulse: 77 81  Resp: 19 (!) 29  Temp: 97.6 F (36.4 C)   SpO2: 96% 100%    General: Awake, no distress.  CV:  Good peripheral perfusion.  Regular rate and rhythm  Resp:  Normal effort.  Equal breath sounds bilaterally.  No wheeze rales or rhonchi. Abd:  No distention.  Soft, nontender.  No rebound or guarding.  ED Results / Procedures / Treatments   EKG  EKG viewed and interpreted by myself shows a normal sinus rhythm at 79 bpm with a narrow QRS, normal axis, normal intervals with nonspecific ST changes.  RADIOLOGY  I  have reviewed interpret the chest x-ray images.  Patient peers have a small right-sided pleural effusion she also has fluid in the fissure as well as cardiomegaly. Radiology is read the x-ray as bilateral Selexid minimal atelectasis with small pleural effusions.   MEDICATIONS ORDERED IN ED: Medications - No data to display   IMPRESSION / MDM / ASSESSMENT AND PLAN / ED COURSE  I reviewed the triage vital signs and the nursing notes.  Patient's presentation is most consistent with acute presentation with potential threat to life or bodily function.  Patient presents to the emergency department for 3 weeks of worsening shortness of breath especially when lying flat.  Patient's physical exam is overall reassuring breathing around 20  breaths/min.  Clear lung sounds without any obvious wheeze rales or rhonchi.  Patient's lab work shows a reassuring CBC with a normal white blood cell count, hemoglobin of 11.8.  Chemistry shows no significant findings.  Troponin of 19 on repeat is 20, no significant change.  EKG shows nonspecific findings but no  ST elevation.  Patient's chest x-ray however does show fluid within the fissure as well as some pleural effusions.  I reviewed the patient's recent CT scan of the chest she is also had a recent echocardiogram and sees cardiology.  I reviewed that the patient was supposed to be taking furosemide  20 mg daily but states she saw a new primary care doctor (Dr. Trudy) about 1 month ago who took her off of the furosemide .  Given the patient's chest x-ray findings and symptoms ongoing for the last 3 weeks after being taken off furosemide  1 month ago I highly suspect that the patient is suffering from increased fluid/pleural effusions and interstitial edema as seen on her x-ray today likely due to to being off of the Lasix .  Patient continues to sat in the upper 90s to 100% on room air.  I had a long discussion with the patient regarding starting the furosemide  back up and  rechecking her labs in about 2 weeks to ensure that her electrolytes are not abnormal.  Patient states she is out of her Lavell Easter and is requesting a refill of that she has all the other medications including furosemide  at home.  Patient will be discharged home with PCP follow-up as well as cardiology follow-up.  FINAL CLINICAL IMPRESSION(S) / ED DIAGNOSES   Dyspnea CHF exacerbation  Rx / DC Orders   Restart furosemide  20 mg daily  Note:  This document was prepared using Dragon voice recognition software and may include unintentional dictation errors.   Dorothyann Drivers, MD 11/06/23 [phone]

## 2024-01-07 ENCOUNTER — Encounter: Attending: Internal Medicine

## 2024-01-07 ENCOUNTER — Other Ambulatory Visit: Payer: Self-pay

## 2024-01-07 DIAGNOSIS — I5022 Chronic systolic (congestive) heart failure: Secondary | ICD-10-CM | POA: Insufficient documentation

## 2024-01-07 NOTE — Progress Notes (Signed)
 Virtual orientation call completed today. shehas an appointment on Date: 01/15/2024. for EP eval and gym Orientation.  Documentation of diagnosis can be found in [facility] Date: 12/05/2023.SABRA

## 2024-01-15 ENCOUNTER — Encounter

## 2024-01-15 VITALS — Ht 62.2 in | Wt 108.4 lb

## 2024-01-15 DIAGNOSIS — I5022 Chronic systolic (congestive) heart failure: Secondary | ICD-10-CM

## 2024-01-15 NOTE — Patient Instructions (Signed)
 Patient Instructions  Patient Details  Name: Marie Fisher MRN: [id number] Date of Birth: [birth year] Referring Provider:  Auston Reyes BIRCH, MD  Below are your personal goals for exercise, nutrition, and risk factors. Our goal is to help you stay on track towards obtaining and maintaining these goals. We will be discussing your progress on these goals with you throughout the program.  Initial Exercise Prescription:  Initial Exercise Prescription - 01/15/24 1500       Date of Initial Exercise RX and Referring Provider   Date 01/15/24    Referring Provider Dr. Juliane Auston      Oxygen   Maintain Oxygen Saturation 88% or higher      Treadmill   MPH 2.2    Grade 0    Minutes 15    METs 2.68      NuStep   Level 3    SPM 80    Minutes 15    METs 2.5      T5 Nustep   Level 2    SPM 80    Minutes 15    METs 2.5      Biostep-RELP   Level 2    SPM 50    Minutes 15    METs 2.5      Track   Laps 32    Minutes 15    METs 2.74      Prescription Details   Duration Progress to 30 minutes of continuous aerobic without signs/symptoms of physical distress      Intensity   THRR 40-80% of Max Heartrate 90-126    Ratings of Perceived Exertion 11-13    Perceived Dyspnea 0-4      Progression   Progression Continue to progress workloads to maintain intensity without signs/symptoms of physical distress.      Resistance Training   Training Prescription Yes    Weight 1lb    Reps 10-15          Exercise Goals: Frequency: Be able to perform aerobic exercise two to three times per week in program working toward 2-5 days per week of home exercise.  Intensity: Work with a perceived exertion of 11 (fairly light) - 15 (hard) while following your exercise prescription.  We will make changes to your prescription with you as you progress through the program.   Duration: Be able to do 30 to 45 minutes of continuous aerobic exercise in addition to a 5 minute warm-up and a 5  minute cool-down routine.   Nutrition Goals: Your personal nutrition goals will be established when you do your nutrition analysis with the dietician.  The following are general nutrition guidelines to follow: Cholesterol < 200mg /day Sodium < 1500mg /day Fiber: Women over 50 yrs - 21 grams per day  Personal Goals:  Personal Goals and Risk Factors at Admission - 01/07/24 1426       Core Components/Risk Factors/Patient Goals on Admission    Weight Management Yes;Weight Maintenance    Intervention Weight Management: Develop a combined nutrition and exercise program designed to reach desired caloric intake, while maintaining appropriate intake of nutrient and fiber, sodium and fats, and appropriate energy expenditure required for the weight goal.;Weight Management: Provide education and appropriate resources to help participant work on and attain dietary goals.;Weight Management/Obesity: Establish reasonable short term and long term weight goals.    Expected Outcomes Short Term: Continue to assess and modify interventions until short term weight is achieved;Weight Maintenance: Understanding of the daily nutrition guidelines, which includes 25-35% calories  from fat, 7% or less cal from saturated fats, less than 200mg  cholesterol, less than 1.5gm of sodium, & 5 or more servings of fruits and vegetables daily;Understanding recommendations for meals to include 15-35% energy as protein, 25-35% energy from fat, 35-60% energy from carbohydrates, less than 200mg  of dietary cholesterol, 20-35 gm of total fiber daily;Understanding of distribution of calorie intake throughout the day with the consumption of 4-5 meals/snacks    Heart Failure Yes    Intervention Provide a combined exercise and nutrition program that is supplemented with education, support and counseling about heart failure. Directed toward relieving symptoms such as shortness of breath, decreased exercise tolerance, and extremity edema.     Expected Outcomes Improve functional capacity of life;Short term: Attendance in program 2-3 days a week with increased exercise capacity. Reported lower sodium intake. Reported increased fruit and vegetable intake. Reports medication compliance.;Short term: Daily weights obtained and reported for increase. Utilizing diuretic protocols set by physician.;Long term: Adoption of self-care skills and reduction of barriers for early signs and symptoms recognition and intervention leading to self-care maintenance.    Hypertension Yes    Intervention Provide education on lifestyle modifcations including regular physical activity/exercise, weight management, moderate sodium restriction and increased consumption of fresh fruit, vegetables, and low fat dairy, alcohol moderation, and smoking cessation.;Monitor prescription use compliance.    Expected Outcomes Short Term: Continued assessment and intervention until BP is < 140/93mm HG in hypertensive participants. < 130/3mm HG in hypertensive participants with diabetes, heart failure or chronic kidney disease.;Long Term: Maintenance of blood pressure at goal levels.    Lipids Yes    Intervention Provide education and support for participant on nutrition & aerobic/resistive exercise along with prescribed medications to achieve LDL 70mg , HDL >40mg .    Expected Outcomes Short Term: Participant states understanding of desired cholesterol values and is compliant with medications prescribed. Participant is following exercise prescription and nutrition guidelines.;Long Term: Cholesterol controlled with medications as prescribed, with individualized exercise RX and with personalized nutrition plan. Value goals: LDL < 70mg , HDL > 40 mg.         Exercise Goals and Review:  Exercise Goals     Row Name 01/15/24 1533             Exercise Goals   Increase Physical Activity Yes       Intervention Provide advice, education, support and counseling about physical  activity/exercise needs.;Develop an individualized exercise prescription for aerobic and resistive training based on initial evaluation findings, risk stratification, comorbidities and participant's personal goals.       Expected Outcomes Short Term: Attend rehab on a regular basis to increase amount of physical activity.;Long Term: Add in home exercise to make exercise part of routine and to increase amount of physical activity.;Long Term: Exercising regularly at least 3-5 days a week.       Increase Strength and Stamina Yes       Intervention Provide advice, education, support and counseling about physical activity/exercise needs.;Develop an individualized exercise prescription for aerobic and resistive training based on initial evaluation findings, risk stratification, comorbidities and participant's personal goals.       Expected Outcomes Short Term: Increase workloads from initial exercise prescription for resistance, speed, and METs.;Short Term: Perform resistance training exercises routinely during rehab and add in resistance training at home;Long Term: Improve cardiorespiratory fitness, muscular endurance and strength as measured by increased METs and functional capacity ( )       Able to understand and use rate of perceived exertion (  RPE) scale Yes       Intervention Provide education and explanation on how to use RPE scale       Expected Outcomes Short Term: Able to use RPE daily in rehab to express subjective intensity level;Long Term:  Able to use RPE to guide intensity level when exercising independently       Able to understand and use Dyspnea scale Yes       Intervention Provide education and explanation on how to use Dyspnea scale       Expected Outcomes Long Term: Able to use Dyspnea scale to guide intensity level when exercising independently;Short Term: Able to use Dyspnea scale daily in rehab to express subjective sense of shortness of breath during exertion       Knowledge and  understanding of Target Heart Rate Range (THRR) Yes       Intervention Provide education and explanation of THRR including how the numbers were predicted and where they are located for reference       Expected Outcomes Short Term: Able to state/look up THRR;Short Term: Able to use daily as guideline for intensity in rehab;Long Term: Able to use THRR to govern intensity when exercising independently       Able to check pulse independently Yes       Intervention Provide education and demonstration on how to check pulse in carotid and radial arteries.;Review the importance of being able to check your own pulse for safety during independent exercise       Expected Outcomes Short Term: Able to explain why pulse checking is important during independent exercise;Long Term: Able to check pulse independently and accurately       Understanding of Exercise Prescription Yes       Intervention Provide education, explanation, and written materials on patient's individual exercise prescription       Expected Outcomes Long Term: Able to explain home exercise prescription to exercise independently;Short Term: Able to explain program exercise prescription          Copy of goals given to participant.

## 2024-01-15 NOTE — Progress Notes (Signed)
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 carbohydrates, less than 200mg  of dietary cholesterol, 20-35 gm of total fiber daily;Understanding of distribution of calorie intake throughout the day with the consumption of 4-5 meals/snacks    Heart Failure Yes    Intervention Provide a combined exercise and nutrition program that is supplemented with education, support and counseling about heart failure. Directed toward relieving symptoms such as shortness of breath, decreased exercise tolerance, and extremity edema.    Expected Outcomes Improve functional capacity of life;Short term: Attendance in program 2-3 days a week with increased exercise capacity. Reported lower sodium intake. Reported increased fruit and vegetable intake. Reports medication compliance.;Short term: Daily weights obtained and reported for increase. Utilizing diuretic protocols set by physician.;Long term:  Adoption of self-care skills and reduction of barriers for early signs and symptoms recognition and intervention leading to self-care maintenance.    Hypertension Yes    Intervention Provide education on lifestyle modifcations including regular physical activity/exercise, weight management, moderate sodium restriction and increased consumption of fresh fruit, vegetables, and low fat dairy, alcohol moderation, and smoking cessation.;Monitor prescription use compliance.    Expected Outcomes Short Term: Continued assessment and intervention until BP is < 140/55mm HG in hypertensive participants. < 130/63mm HG in hypertensive participants with diabetes, heart failure or chronic kidney disease.;Long Term: Maintenance of blood pressure at goal levels.    Lipids Yes    Intervention Provide education and support for participant on nutrition & aerobic/resistive exercise along with prescribed medications to achieve LDL 70mg , HDL >40mg .    Expected Outcomes Short Term: Participant states understanding of desired cholesterol values and is compliant with medications prescribed. Participant is following exercise prescription and nutrition guidelines.;Long Term: Cholesterol controlled with medications as prescribed, with individualized exercise RX and with personalized nutrition plan. Value goals: LDL < 70mg , HDL > 40 mg.          Education:Diabetes - Individual verbal and written instruction to review signs/symptoms of diabetes, desired ranges of glucose level fasting, after meals and with exercise. Acknowledge that pre and post exercise glucose checks will be done for 3 sessions at entry of program.   Core Components/Risk Factors/Patient Goals Review:    Core Components/Risk Factors/Patient Goals at Discharge (Final Review):    ITP Comments:  ITP Comments  Row Name 01/07/24 1434 01/15/24 1518         ITP Comments Virtual orientation call completed today. shehas an appointment on Date: 01/15/2024.   for EP eval and gym Orientation.  Documentation of diagnosis can be found in [facility] Date: 12/05/2023. SABRA Completed and gym orientation for cardiac rehab. Initial ITP created and sent for review to Dr. Oneil Pinal, Medical Director.         Comments: Initial ITP

## 2024-01-16 ENCOUNTER — Encounter: Payer: Self-pay | Admitting: *Deleted

## 2024-01-16 DIAGNOSIS — I5022 Chronic systolic (congestive) heart failure: Secondary | ICD-10-CM

## 2024-01-16 NOTE — Progress Notes (Signed)
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 carbohydrates, less than 200mg  of dietary cholesterol, 20-35 gm of total fiber daily;Understanding of distribution of calorie intake throughout the day with the consumption of 4-5 meals/snacks    Heart Failure Yes    Intervention Provide a combined exercise and nutrition program that is supplemented with education, support and counseling about heart failure. Directed toward relieving symptoms such as shortness of breath, decreased exercise tolerance, and extremity edema.    Expected Outcomes Improve functional capacity of life;Short term: Attendance in program 2-3 days a week with increased exercise capacity. Reported lower sodium intake. Reported increased fruit and vegetable intake. Reports medication compliance.;Short term: Daily weights obtained and reported for increase. Utilizing diuretic protocols set by physician.;Long term:  Adoption of self-care skills and reduction of barriers for early signs and symptoms recognition and intervention leading to self-care maintenance.    Hypertension Yes    Intervention Provide education on lifestyle modifcations including regular physical activity/exercise, weight management, moderate sodium restriction and increased consumption of fresh fruit, vegetables, and low fat dairy, alcohol moderation, and smoking cessation.;Monitor prescription use compliance.    Expected Outcomes Short Term: Continued assessment and intervention until BP is < 140/47mm HG in hypertensive participants. < 130/52mm HG in hypertensive participants with diabetes, heart failure or chronic kidney disease.;Long Term: Maintenance of blood pressure at goal levels.    Lipids Yes    Intervention Provide education and support for participant on nutrition & aerobic/resistive exercise along with prescribed medications to achieve LDL 70mg , HDL >40mg .    Expected Outcomes Short Term: Participant states understanding of desired cholesterol values and is compliant with medications prescribed. Participant is following exercise prescription and nutrition guidelines.;Long Term: Cholesterol controlled with medications as prescribed, with individualized exercise RX and with personalized nutrition plan. Value goals: LDL < 70mg , HDL > 40 mg.          Education:Diabetes - Individual verbal and written instruction to review signs/symptoms of diabetes, desired ranges of glucose level fasting, after meals and with exercise. Acknowledge that pre and post exercise glucose checks will be done for 3 sessions at entry of program.   Core Components/Risk Factors/Patient Goals Review:    Core Components/Risk Factors/Patient Goals at Discharge (Final Review):    ITP Comments:  ITP Comments  Row Name 01/07/24 1434 01/15/24 1518 01/16/24 1109       ITP Comments Virtual orientation call completed today. shehas an appointment on Date:  01/15/2024.  for EP eval and gym Orientation.  Documentation of diagnosis can be found in [facility] Date: 12/05/2023. SABRA Completed and gym orientation for cardiac rehab. Initial ITP created and sent for review to Dr. Oneil Pinal, Medical Director. 30 Day review completed. Medical Director ITP review done, changes made as directed, and signed approval by Medical Director.        Comments: 30 day review

## 2024-01-23 ENCOUNTER — Encounter

## 2024-01-24 ENCOUNTER — Encounter

## 2024-01-30 ENCOUNTER — Encounter

## 2024-01-31 ENCOUNTER — Encounter

## 2024-02-06 ENCOUNTER — Encounter: Attending: Internal Medicine

## 2024-02-06 DIAGNOSIS — I5022 Chronic systolic (congestive) heart failure: Secondary | ICD-10-CM | POA: Insufficient documentation

## 2024-02-06 NOTE — Progress Notes (Signed)
 Daily Session Note  Patient Details  Name: Marie Fisher MRN: [id number] Date of Birth: [birth year] Referring Provider:   Flowsheet Row Cardiac Rehab from 01/15/2024 in [facility] Cardiac and Pulmonary Rehab  Referring Provider Dr. Juliane Costa    Encounter Date: 02/06/2024  Check In:  Session Check In - 02/06/24 1350       Check-In   Supervising physician immediately available to respond to emergencies See telemetry face sheet for immediately available ER MD    Location [facility]-Cardiac & Pulmonary Rehab    Staff Present Burnard Davenport RN,BSN,MPA;Laura Cates RN,BSN;Keano Guggenheim Dyane BS, ACSM CEP, Exercise Physiologist;Noah Tickle, BS, Exercise Physiologist    Virtual Visit No    Medication changes reported     No    Fall or balance concerns reported    No    Tobacco Cessation No Change    Warm-up and Cool-down Performed on first and last piece of equipment    Resistance Training Performed Yes    VAD Patient? No    PAD/SET Patient? No      Pain Assessment   Currently in Pain? No/denies             Social History   Tobacco Use  Smoking Status Never  Smokeless Tobacco Never    Goals Met:  Independence with exercise equipment Exercise tolerated well No report of concerns or symptoms today Strength training completed today  Goals Unmet:  Not Applicable  Comments: First full day of exercise!  Patient was oriented to gym and equipment including functions, settings, policies, and procedures.  Patient's individual exercise prescription and treatment plan were reviewed.  All starting workloads were established based on the results of the 6 minute walk test done at initial orientation visit.  The plan for exercise progression was also introduced and progression will be customized based on patient's performance and goals.    Dr. Oneil Pinal is Medical Director for [facility] Cardiac Rehabilitation.  Dr. Fuad Aleskerov is Medical Director for [facility] Pulmonary  Rehabilitation.

## 2024-02-07 ENCOUNTER — Encounter

## 2024-02-07 ENCOUNTER — Encounter: Admitting: *Deleted

## 2024-02-07 DIAGNOSIS — I5022 Chronic systolic (congestive) heart failure: Secondary | ICD-10-CM | POA: Diagnosis not present

## 2024-02-07 NOTE — Progress Notes (Signed)
 Daily Session Note  Patient Details  Name: Marie Fisher MRN: [id number] Date of Birth: 09-16-1945 Referring Provider:   Flowsheet Row Cardiac Rehab from 01/15/2024 in [facility] Cardiac and Pulmonary Rehab  Referring Provider Dr. Juliane Costa    Encounter Date: 02/07/2024  Check In:  Session Check In - 02/07/24 1405       Check-In   Supervising physician immediately available to respond to emergencies See telemetry face sheet for immediately available ER MD    Location [facility]-Cardiac & Pulmonary Rehab    Staff Present Hoy Rodney RN,BSN;[facility] RCP,RRT,BSRT;Margaret Best, MS, Exercise Physiologist;Noah Tickle, BS, Exercise Physiologist    Virtual Visit No    Medication changes reported     No    Fall or balance concerns reported    No    Warm-up and Cool-down Performed on first and last piece of equipment    Resistance Training Performed Yes    VAD Patient? No    PAD/SET Patient? No      Pain Assessment   Currently in Pain? No/denies             Social History   Tobacco Use  Smoking Status Never  Smokeless Tobacco Never    Goals Met:  Independence with exercise equipment Exercise tolerated well No report of concerns or symptoms today Strength training completed today  Goals Unmet:  Not Applicable  Comments: Pt able to follow exercise prescription today without complaint.  Will continue to monitor for progression.    Dr. Oneil Pinal is Medical Director for [facility] Cardiac Rehabilitation.  Dr. Fuad Aleskerov is Medical Director for [facility] Pulmonary Rehabilitation.

## 2024-02-11 ENCOUNTER — Encounter

## 2024-02-13 ENCOUNTER — Encounter

## 2024-02-13 ENCOUNTER — Encounter: Admitting: Emergency Medicine

## 2024-02-13 DIAGNOSIS — I5022 Chronic systolic (congestive) heart failure: Secondary | ICD-10-CM

## 2024-02-13 NOTE — Progress Notes (Signed)
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 Score:  Knowledge Questionnaire Score - 01/15/24 1535       Knowledge Questionnaire Score   Pre Score 22/26          Core Components/Risk Factors/Patient Goals at Admission:  Personal Goals and Risk Factors at Admission - 01/07/24 1426       Core Components/Risk Factors/Patient Goals on Admission    Weight Management Yes;Weight Maintenance    Intervention Weight Management: Develop a combined nutrition and exercise program designed to reach desired caloric intake, while maintaining appropriate intake of nutrient and fiber, sodium and fats, and appropriate energy expenditure required for the weight goal.;Weight Management: Provide education and appropriate resources to help participant work on and attain dietary goals.;Weight Management/Obesity: Establish reasonable short term and long term weight goals.     Expected Outcomes Short Term: Continue to assess and modify interventions until short term weight is achieved;Weight Maintenance: Understanding of the daily nutrition guidelines, which includes 25-35% calories from fat, 7% or less cal from saturated fats, less than 200mg  cholesterol, less than 1.5gm of sodium, & 5 or more servings of fruits and vegetables daily;Understanding recommendations for meals to include 15-35% energy as protein, 25-35% energy from fat, 35-60% energy from carbohydrates, less than 200mg  of dietary cholesterol, 20-35 gm of total fiber daily;Understanding of distribution of calorie intake throughout the day with the consumption of 4-5 meals/snacks    Heart Failure Yes    Intervention Provide a combined exercise and nutrition program that is supplemented with education, support and counseling about heart failure. Directed toward relieving symptoms such as shortness of breath, decreased exercise tolerance, and extremity edema.    Expected Outcomes Improve functional capacity of life;Short term: Attendance in program 2-3 days a week with increased exercise capacity. Reported lower sodium intake. Reported increased fruit and vegetable intake. Reports medication compliance.;Short term: Daily weights obtained and reported for increase. Utilizing diuretic protocols set by physician.;Long term: Adoption of self-care skills and reduction of barriers for early signs and symptoms recognition and intervention leading to  self-care maintenance.    Hypertension Yes    Intervention Provide education on lifestyle modifcations including regular physical activity/exercise, weight management, moderate sodium restriction and increased consumption of fresh fruit, vegetables, and low fat dairy, alcohol moderation, and smoking cessation.;Monitor prescription use compliance.    Expected Outcomes Short Term: Continued assessment and intervention until BP is < 140/44mm HG in hypertensive participants. < 130/43mm HG  in hypertensive participants with diabetes, heart failure or chronic kidney disease.;Long Term: Maintenance of blood pressure at goal levels.    Lipids Yes    Intervention Provide education and support for participant on nutrition & aerobic/resistive exercise along with prescribed medications to achieve LDL 70mg , HDL >40mg .    Expected Outcomes Short Term: Participant states understanding of desired cholesterol values and is compliant with medications prescribed. Participant is following exercise prescription and nutrition guidelines.;Long Term: Cholesterol controlled with medications as prescribed, with individualized exercise RX and with personalized nutrition plan. Value goals: LDL < 70mg , HDL > 40 mg.          Education:Diabetes - Individual verbal and written instruction to review signs/symptoms of diabetes, desired ranges of glucose level fasting, after meals and with exercise. Acknowledge that pre and post exercise glucose checks will be done for 3 sessions at entry of program.   Core Components/Risk Factors/Patient Goals Review:    Core Components/Risk Factors/Patient Goals at Discharge (Final Review):    ITP Comments:  ITP Comments     Row Name 01/07/24 1434 01/15/24 1518 01/16/24 1109 02/06/24 1351 02/13/24 1005   ITP Comments Virtual orientation call completed today. shehas an appointment on Date: 01/15/2024.  for EP eval and gym Orientation.  Documentation of diagnosis can be found in [facility] Date: 12/05/2023. SABRA Completed and gym orientation for cardiac rehab. Initial ITP created and sent for review to Dr. Oneil Pinal, Medical Director. 30 Day review completed. Medical Director ITP review done, changes made as directed, and signed approval by Medical Director. First full day of exercise!  Patient was oriented to gym and equipment including functions, settings, policies, and procedures.  Patient's individual exercise prescription and treatment plan were reviewed.  All starting  workloads were established based on the results of the 6 minute walk test done at initial orientation visit.  The plan for exercise progression was also introduced and progression will be customized based on patient's performance and goals. 30 Day review completed. Medical Director ITP review done, changes made as directed, and signed approval by Medical Director. New to program.      Comments: 30 day review

## 2024-02-13 NOTE — Progress Notes (Signed)
 Daily Session Note  Patient Details  Name: ALAIZA YAU MRN: [id number] Date of Birth: 24-Jun-1945 Referring Provider:   Flowsheet Row Cardiac Rehab from 01/15/2024 in [facility] Cardiac and Pulmonary Rehab  Referring Provider Dr. Juliane Costa    Encounter Date: 02/13/2024  Check In:  Session Check In - 02/13/24 1407       Check-In   Supervising physician immediately available to respond to emergencies See telemetry face sheet for immediately available ER MD    Location [facility]-Cardiac & Pulmonary Rehab    Staff Present Leita Franks RN,BSN;[facility] Dyane BS, ACSM CEP, Exercise Physiologist;Kelly Bollinger [facility]    Virtual Visit No    Medication changes reported     No    Fall or balance concerns reported    No    Tobacco Cessation No Change    Warm-up and Cool-down Performed on first and last piece of equipment    Resistance Training Performed Yes    VAD Patient? No    PAD/SET Patient? No      Pain Assessment   Currently in Pain? No/denies             Social History   Tobacco Use  Smoking Status Never  Smokeless Tobacco Never    Goals Met:  Independence with exercise equipment Exercise tolerated well No report of concerns or symptoms today Strength training completed today  Goals Unmet:  Not Applicable  Comments: Pt able to follow exercise prescription today without complaint.  Will continue to monitor for progression.    Dr. Oneil Pinal is Medical Director for [facility] Cardiac Rehabilitation.  Dr. Fuad Aleskerov is Medical Director for [facility] Pulmonary Rehabilitation.

## 2024-02-14 ENCOUNTER — Encounter

## 2024-02-18 ENCOUNTER — Encounter

## 2024-02-20 ENCOUNTER — Encounter

## 2024-02-25 ENCOUNTER — Encounter

## 2024-02-27 ENCOUNTER — Encounter

## 2024-02-28 ENCOUNTER — Encounter

## 2024-03-03 ENCOUNTER — Encounter

## 2024-03-05 ENCOUNTER — Encounter

## 2024-03-05 ENCOUNTER — Telehealth: Payer: Self-pay | Admitting: *Deleted

## 2024-03-05 NOTE — Telephone Encounter (Signed)
 Marie Fisher called today to say she would be out sick and wanted to be put on hold til January. She was informed that we could put her on medical hold but that her class spot would not be saved. She will call the last week of December to see what class times are available. She was put on the wait list for the 1:45 class and if spots are available when she is ready to return she can be put back in her same class.

## 2024-03-06 ENCOUNTER — Encounter

## 2024-03-10 ENCOUNTER — Encounter

## 2024-03-12 ENCOUNTER — Encounter

## 2024-03-12 DIAGNOSIS — I5022 Chronic systolic (congestive) heart failure: Secondary | ICD-10-CM

## 2024-03-12 NOTE — Progress Notes (Signed)
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 intake of nutrient and fiber, sodium and fats, and appropriate energy expenditure required for the weight goal.;Weight Management: Provide education and appropriate resources to help participant work on and attain dietary goals.;Weight Management/Obesity: Establish reasonable short term and long term weight goals.    Expected Outcomes Short Term: Continue to assess and modify interventions until short term weight is achieved;Weight Maintenance: Understanding of the daily nutrition guidelines, which includes 25-35% calories from fat, 7% or less cal from saturated fats, less than 200mg  cholesterol, less than 1.5gm of sodium, & 5 or more servings of fruits and vegetables daily;Understanding recommendations for meals to include 15-35% energy as protein, 25-35% energy from fat, 35-60% energy from  carbohydrates, less than 200mg  of dietary cholesterol, 20-35 gm of total fiber daily;Understanding of distribution of calorie intake throughout the day with the consumption of 4-5 meals/snacks    Heart Failure Yes    Intervention Provide a combined exercise and nutrition program that is supplemented with education, support and counseling about heart failure. Directed toward relieving symptoms such as shortness of breath, decreased exercise tolerance, and extremity edema.    Expected Outcomes Improve functional capacity of life;Short term: Attendance in program 2-3 days a week with increased exercise capacity. Reported lower sodium intake. Reported increased fruit and vegetable intake. Reports medication compliance.;Short term: Daily weights obtained and reported for increase. Utilizing diuretic protocols set by physician.;Long term: Adoption of self-care skills and reduction of barriers for early signs and symptoms recognition and intervention leading to self-care maintenance.    Hypertension Yes    Intervention Provide education on lifestyle modifcations including regular physical activity/exercise, weight management, moderate sodium restriction and increased consumption of fresh fruit, vegetables, and low fat dairy, alcohol moderation, and smoking cessation.;Monitor prescription use compliance.    Expected Outcomes Short Term: Continued assessment and intervention until BP is < 140/60mm HG in hypertensive participants. < 130/28mm HG in hypertensive participants with diabetes, heart failure or chronic kidney disease.;Long Term: Maintenance of blood pressure at goal levels.    Lipids Yes    Intervention Provide education and support for participant on nutrition &  aerobic/resistive exercise along with prescribed medications to achieve LDL 70mg , HDL >40mg .    Expected Outcomes Short Term: Participant states understanding of desired cholesterol values and is compliant with medications prescribed. Participant  is following exercise prescription and nutrition guidelines.;Long Term: Cholesterol controlled with medications as prescribed, with individualized exercise RX and with personalized nutrition plan. Value goals: LDL < 70mg , HDL > 40 mg.          Education:Diabetes - Individual verbal and written instruction to review signs/symptoms of diabetes, desired ranges of glucose level fasting, after meals and with exercise. Acknowledge that pre and post exercise glucose checks will be done for 3 sessions at entry of program.   Core Components/Risk Factors/Patient Goals Review:    Core Components/Risk Factors/Patient Goals at Discharge (Final Review):    ITP Comments:  ITP Comments     Row Name 01/07/24 1434 01/15/24 1518 01/16/24 1109 02/06/24 1351 02/13/24 1005   ITP Comments Virtual orientation call completed today. shehas an appointment on Date: 01/15/2024.  for EP eval and gym Orientation.  Documentation of diagnosis can be found in [facility] Date: 12/05/2023. SABRA Completed and gym orientation for cardiac rehab. Initial ITP created and sent for review to Dr. Oneil Pinal, Medical Director. 30 Day review completed. Medical Director ITP review done, changes made as directed, and signed approval by Medical Director. First full day of exercise!  Patient was oriented to gym and equipment including functions, settings, policies, and procedures.  Patient's individual exercise prescription and treatment plan were reviewed.  All starting workloads were established based on the results of the 6 minute walk test done at initial orientation visit.  The plan for exercise progression was also introduced and progression will be customized based on patient's performance and goals. 30 Day review completed. Medical Director ITP review done, changes made as directed, and signed approval by Medical Director. New to program.    Row Name 03/12/24 1043           ITP Comments 30 Day review completed. Medical Director ITP  review done, changes made as directed, and signed approval by Medical Director.          Comments: 30 day review    [1]  Current Outpatient Medications:    amLODipine (NORVASC) 5 MG tablet, Take 5 mg by mouth daily., Disp: , Rfl:    aspirin  81 MG tablet, Take 81 mg by mouth daily., Disp: , Rfl:    atorvastatin (LIPITOR) 20 MG tablet, , Disp: , Rfl:    bisoprolol  (ZEBETA ) 5 MG tablet, Take 5 mg by mouth daily. Reported on 09/23/2015, Disp: , Rfl:    buPROPion  (WELLBUTRIN  XL) 300 MG 24 hr tablet, Take 300 mg by mouth daily., Disp: , Rfl:    Calcium Citrate 200 MG TABS, Take 300 mg by mouth., Disp: , Rfl:    candesartan (ATACAND) 32 MG tablet, Take 32 mg by mouth daily. (Patient not taking: Reported on 02/02/2021), Disp: , Rfl:    cholecalciferol (VITAMIN D3) 400 UNT/0.03ML LIQD, Take 0.06 mL by mouth daily., Disp: , Rfl:    Cranberry-Vitamin C  (AZO CRANBERRY URINARY TRACT PO), , Disp: , Rfl:    DHEA 25 MG CAPS, Take 5 mg by mouth daily. , Disp: , Rfl:    docusate sodium (COLACE) 100 MG capsule, Take 100 mg by mouth 2 (two) times daily., Disp: , Rfl:    estradiol  (ESTRACE ) 0.1 MG/GM vaginal cream, Place 0.5 Applicatorfuls vaginally 2 (two) times a week., Disp: 42.5 g, Rfl:  6   Evening Primrose Oil 1000 MG CAPS, , Disp: , Rfl:    furosemide  (LASIX ) 20 MG tablet, Take 1 tablet (20 mg total) by mouth daily., Disp: 30 tablet, Rfl: 2   hyoscyamine  (LEVSIN ) 0.125 MG tablet, Take 1 tablet (0.125 mg total) by mouth every 6 (six) hours as needed for bladder spasms or cramping., Disp: 30 tablet, Rfl: 2   Ipratropium-Albuterol (COMBIVENT) 20-100 MCG/ACT AERS respimat, Inhale into the lungs., Disp: , Rfl:    liothyronine  (CYTOMEL ) 25 MCG tablet, Take 12.5 mcg by mouth 2 (two) times daily. , Disp: , Rfl:    Magnesium Citrate 100 MG TABS, Take by mouth., Disp: , Rfl:    pantoprazole (PROTONIX) 40 MG tablet, Take by mouth., Disp: , Rfl:    PHOSPHATIDYL CHOLINE PO, Take 1 tablet by mouth daily. , Disp: , Rfl:     Probiotic Product (RA PROBIOTIC MAX STRENGTH PO), Take by mouth., Disp: , Rfl:    Saw Palmetto 1000 MG CAPS, Take by mouth., Disp: , Rfl:    SUMAtriptan (IMITREX) 20 MG/ACT nasal spray, Place 20 mg into the nose every 2 (two) hours as needed for migraine. 1 Inhaler every two (2) hours as needed., Disp: , Rfl:    [employer] [id number] IV, Inject into the vein., Disp: , Rfl:    UNABLE TO FIND, 4 tablets daily. Med Name: Lipotropix, Disp: , Rfl:    UNABLE TO FIND, 5,000 mcg 2 (two) times daily. Med Name: omega pure 820- DR V, Disp: , Rfl:    UNABLE TO FIND, 2 tablets daily. Med Name: Osteosheath-DR V, Disp: , Rfl:    UNABLE TO FIND, Med Name: bio-ae, Disp: , Rfl:    UNABLE TO FIND, Med Name: biotagen, Disp: , Rfl:    UNABLE TO FIND, Med Name: boro tab, Disp: , Rfl:    UNABLE TO FIND, Med Name: gluterase, Disp: , Rfl:    UNABLE TO FIND, Med Name: memorall, Disp: , Rfl:    UNABLE TO FIND, Med Name: methyl protect, Disp: , Rfl:    valACYclovir  (VALTREX ) 500 MG tablet, Take 500 mg by mouth 2 (two) times daily., Disp: , Rfl:    vitamin C  (ASCORBIC ACID ) 500 MG tablet, Take 500 mg by mouth daily., Disp: , Rfl:  [2]  Social History Tobacco Use  Smoking Status Never  Smokeless Tobacco Never

## 2024-03-13 ENCOUNTER — Encounter

## 2024-03-17 ENCOUNTER — Encounter

## 2024-03-19 ENCOUNTER — Encounter

## 2024-03-24 ENCOUNTER — Encounter

## 2024-03-26 ENCOUNTER — Encounter

## 2024-03-31 ENCOUNTER — Encounter

## 2024-04-02 ENCOUNTER — Encounter

## 2024-04-03 ENCOUNTER — Encounter

## 2024-04-07 ENCOUNTER — Encounter

## 2024-04-09 ENCOUNTER — Encounter

## 2024-04-09 ENCOUNTER — Encounter: Payer: Self-pay | Admitting: *Deleted

## 2024-04-09 DIAGNOSIS — I5022 Chronic systolic (congestive) heart failure: Secondary | ICD-10-CM

## 2024-04-09 NOTE — Progress Notes (Signed)
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 Goals at Admission:  Personal Goals and Risk Factors at Admission - 01/07/24 1426       Core Components/Risk Factors/Patient Goals on Admission    Weight Management Yes;Weight Maintenance    Intervention Weight Management: Develop a combined nutrition and exercise program designed to reach desired caloric intake, while maintaining appropriate intake of nutrient and fiber, sodium and fats, and appropriate energy expenditure required for the weight goal.;Weight Management: Provide education and appropriate resources to help participant work on and attain dietary goals.;Weight Management/Obesity: Establish reasonable short term and long term weight goals.    Expected Outcomes Short Term: Continue to assess and modify interventions until short term weight is achieved;Weight Maintenance: Understanding  of the daily nutrition guidelines, which includes 25-35% calories from fat, 7% or less cal from saturated fats, less than 200mg  cholesterol, less than 1.5gm of sodium, & 5 or more servings of fruits and vegetables daily;Understanding recommendations for meals to include 15-35% energy as protein, 25-35% energy from fat, 35-60% energy from carbohydrates, less than 200mg  of dietary cholesterol, 20-35 gm of total fiber daily;Understanding of distribution of calorie intake throughout the day with the consumption of 4-5 meals/snacks    Heart Failure Yes    Intervention Provide a combined exercise and nutrition program that is supplemented with education, support and counseling about heart failure. Directed toward relieving symptoms such as shortness of breath, decreased exercise tolerance, and extremity edema.    Expected Outcomes Improve functional capacity of life;Short term: Attendance in program 2-3 days a week with increased exercise capacity. Reported lower sodium intake. Reported increased fruit and vegetable intake. Reports medication compliance.;Short term: Daily weights obtained and reported for increase. Utilizing diuretic protocols set by physician.;Long term: Adoption of self-care skills and reduction of barriers for early signs and symptoms recognition and intervention leading to self-care maintenance.    Hypertension Yes    Intervention Provide education on lifestyle modifcations including regular physical activity/exercise, weight management, moderate sodium restriction and increased consumption of fresh fruit, vegetables, and low fat dairy, alcohol moderation, and smoking cessation.;Monitor prescription use compliance.    Expected Outcomes  Short Term: Continued assessment and intervention until BP is < 140/37mm HG in hypertensive participants. < 130/43mm HG in hypertensive participants with diabetes, heart failure or chronic kidney disease.;Long Term: Maintenance of blood pressure at goal levels.     Lipids Yes    Intervention Provide education and support for participant on nutrition & aerobic/resistive exercise along with prescribed medications to achieve LDL 70mg , HDL >40mg .    Expected Outcomes Short Term: Participant states understanding of desired cholesterol values and is compliant with medications prescribed. Participant is following exercise prescription and nutrition guidelines.;Long Term: Cholesterol controlled with medications as prescribed, with individualized exercise RX and with personalized nutrition plan. Value goals: LDL < 70mg , HDL > 40 mg.          Education:Diabetes - Individual verbal and written instruction to review signs/symptoms of diabetes, desired ranges of glucose level fasting, after meals and with exercise. Acknowledge that pre and post exercise glucose checks will be done for 3 sessions at entry of program.   Core Components/Risk Factors/Patient Goals Review:    Core Components/Risk Factors/Patient Goals at Discharge (Final Review):    ITP Comments:  ITP Comments     Row Name 01/07/24 1434 01/15/24 1518 01/16/24 1109 02/06/24 1351 02/13/24 1005   ITP Comments Virtual orientation call completed today. shehas an appointment on Date: 01/15/2024.  for EP eval and gym Orientation.  Documentation of diagnosis can be found in [facility] Date: 12/05/2023. SABRA Completed and gym orientation for cardiac rehab. Initial ITP created and sent for review to Dr. Oneil Pinal, Medical Director. 30 Day review completed. Medical Director ITP review done, changes made as directed, and signed approval by Medical Director. First full day of exercise!  Patient was oriented to gym and equipment including functions, settings, policies, and procedures.  Patient's individual exercise prescription and treatment plan were reviewed.  All starting workloads were established based on the results of the 6 minute walk test done at initial orientation visit.  The plan for exercise progression was  also introduced and progression will be customized based on patient's performance and goals. 30 Day review completed. Medical Director ITP review done, changes made as directed, and signed approval by Medical Director. New to program.    Row Name 03/12/24 1043 04/09/24 1159         ITP Comments 30 Day review completed. Medical Director ITP review done, changes made as directed, and signed approval by Medical Director. 30 Day review completed. Medical Director ITP review done, changes made as directed, and signed approval by Medical Director. Has been on hold due to sickness since 11/20. Plans to return Monday 1/19         Comments: 30 day review     [1]  Current Outpatient Medications:    amLODipine (NORVASC) 5 MG tablet, Take 5 mg by mouth daily., Disp: , Rfl:    aspirin  81 MG tablet, Take 81 mg by mouth daily., Disp: , Rfl:    atorvastatin (LIPITOR) 20 MG tablet, , Disp: , Rfl:    bisoprolol  (ZEBETA ) 5 MG tablet, Take 5 mg by mouth daily. Reported on 09/23/2015, Disp: , Rfl:    buPROPion  (WELLBUTRIN  XL) 300 MG 24 hr tablet, Take 300 mg by mouth daily., Disp: , Rfl:    Calcium Citrate 200 MG TABS, Take 300 mg by mouth., Disp: , Rfl:    candesartan (ATACAND) 32 MG tablet, Take 32 mg by mouth daily. (Patient not taking: Reported on 02/02/2021), Disp: , Rfl:    cholecalciferol (  VITAMIN D3) 400 UNT/0.03ML LIQD, Take 0.06 mL by mouth daily., Disp: , Rfl:    Cranberry-Vitamin C  (AZO CRANBERRY URINARY TRACT PO), , Disp: , Rfl:    DHEA 25 MG CAPS, Take 5 mg by mouth daily. , Disp: , Rfl:    docusate sodium (COLACE) 100 MG capsule, Take 100 mg by mouth 2 (two) times daily., Disp: , Rfl:    estradiol  (ESTRACE ) 0.1 MG/GM vaginal cream, Place 0.5 Applicatorfuls vaginally 2 (two) times a week., Disp: 42.5 g, Rfl: 6   Evening Primrose Oil 1000 MG CAPS, , Disp: , Rfl:    furosemide  (LASIX ) 20 MG tablet, Take 1 tablet (20 mg total) by mouth daily., Disp: 30 tablet, Rfl: 2   hyoscyamine  (LEVSIN ) 0.125 MG  tablet, Take 1 tablet (0.125 mg total) by mouth every 6 (six) hours as needed for bladder spasms or cramping., Disp: 30 tablet, Rfl: 2   Ipratropium-Albuterol (COMBIVENT) 20-100 MCG/ACT AERS respimat, Inhale into the lungs., Disp: , Rfl:    liothyronine  (CYTOMEL ) 25 MCG tablet, Take 12.5 mcg by mouth 2 (two) times daily. , Disp: , Rfl:    Magnesium Citrate 100 MG TABS, Take by mouth., Disp: , Rfl:    pantoprazole (PROTONIX) 40 MG tablet, Take by mouth., Disp: , Rfl:    PHOSPHATIDYL CHOLINE PO, Take 1 tablet by mouth daily. , Disp: , Rfl:    Probiotic Product (RA PROBIOTIC MAX STRENGTH PO), Take by mouth., Disp: , Rfl:    Saw Palmetto 1000 MG CAPS, Take by mouth., Disp: , Rfl:    SUMAtriptan (IMITREX) 20 MG/ACT nasal spray, Place 20 mg into the nose every 2 (two) hours as needed for migraine. 1 Inhaler every two (2) hours as needed., Disp: , Rfl:    [employer] [id number] IV, Inject into the vein., Disp: , Rfl:    UNABLE TO FIND, 4 tablets daily. Med Name: Lipotropix, Disp: , Rfl:    UNABLE TO FIND, 5,000 mcg 2 (two) times daily. Med Name: omega pure 820- DR V, Disp: , Rfl:    UNABLE TO FIND, 2 tablets daily. Med Name: Osteosheath-DR V, Disp: , Rfl:    UNABLE TO FIND, Med Name: bio-ae, Disp: , Rfl:    UNABLE TO FIND, Med Name: biotagen, Disp: , Rfl:    UNABLE TO FIND, Med Name: boro tab, Disp: , Rfl:    UNABLE TO FIND, Med Name: gluterase, Disp: , Rfl:    UNABLE TO FIND, Med Name: memorall, Disp: , Rfl:    UNABLE TO FIND, Med Name: methyl protect, Disp: , Rfl:    valACYclovir  (VALTREX ) 500 MG tablet, Take 500 mg by mouth 2 (two) times daily., Disp: , Rfl:    vitamin C  (ASCORBIC ACID ) 500 MG tablet, Take 500 mg by mouth daily., Disp: , Rfl:  [2]  Social History Tobacco Use  Smoking Status Never  Smokeless Tobacco Never

## 2024-04-10 ENCOUNTER — Encounter

## 2024-04-14 ENCOUNTER — Encounter

## 2024-04-16 ENCOUNTER — Encounter

## 2024-04-17 ENCOUNTER — Encounter

## 2024-04-21 ENCOUNTER — Encounter

## 2024-04-23 ENCOUNTER — Encounter

## 2024-04-24 ENCOUNTER — Encounter

## 2024-04-28 ENCOUNTER — Encounter

## 2024-04-30 ENCOUNTER — Encounter

## 2024-05-01 ENCOUNTER — Encounter

## 2024-05-05 ENCOUNTER — Encounter

## 2024-05-07 ENCOUNTER — Encounter

## 2024-05-08 ENCOUNTER — Encounter

## 2024-05-12 ENCOUNTER — Encounter

## 2024-05-14 ENCOUNTER — Encounter

## 2024-05-15 ENCOUNTER — Encounter

## 2024-05-19 ENCOUNTER — Encounter

## 2024-05-21 ENCOUNTER — Encounter

## 2024-05-26 ENCOUNTER — Encounter

## 2024-05-28 ENCOUNTER — Encounter

## 2024-06-02 ENCOUNTER — Encounter

## 2024-06-04 ENCOUNTER — Encounter
